# Patient Record
Sex: Female | Born: 1960 | Race: White | Hispanic: No | Marital: Married | Smoking: Former smoker
Health system: Southern US, Community
[De-identification: ages and names within clinical notes are randomized; demographics above are authoritative.]

## PROBLEM LIST (undated history)

## (undated) DIAGNOSIS — M8580 Other specified disorders of bone density and structure, unspecified site: Secondary | ICD-10-CM

## (undated) DIAGNOSIS — R140 Abdominal distension (gaseous): Secondary | ICD-10-CM

## (undated) DIAGNOSIS — M1990 Unspecified osteoarthritis, unspecified site: Secondary | ICD-10-CM

## (undated) DIAGNOSIS — Z8742 Personal history of other diseases of the female genital tract: Secondary | ICD-10-CM

## (undated) DIAGNOSIS — F419 Anxiety disorder, unspecified: Secondary | ICD-10-CM

## (undated) DIAGNOSIS — IMO0001 Reserved for inherently not codable concepts without codable children: Secondary | ICD-10-CM

## (undated) DIAGNOSIS — K5790 Diverticulosis of intestine, part unspecified, without perforation or abscess without bleeding: Secondary | ICD-10-CM

## (undated) HISTORY — PX: REDUCTION MAMMAPLASTY: SUR839

## (undated) HISTORY — PX: ABDOMINAL HYSTERECTOMY: SHX81

## (undated) HISTORY — PX: BREAST ENHANCEMENT SURGERY: SHX7

## (undated) HISTORY — DX: Other specified disorders of bone density and structure, unspecified site: M85.80

## (undated) HISTORY — PX: AUGMENTATION MAMMAPLASTY: SUR837

## (undated) HISTORY — DX: Personal history of other diseases of the female genital tract: Z87.42

## (undated) HISTORY — DX: Abdominal distension (gaseous): R14.0

## (undated) HISTORY — DX: Diverticulosis of intestine, part unspecified, without perforation or abscess without bleeding: K57.90

## (undated) HISTORY — PX: COSMETIC SURGERY: SHX468

## (undated) HISTORY — PX: OTHER SURGICAL HISTORY: SHX169

## (undated) HISTORY — DX: Reserved for inherently not codable concepts without codable children: IMO0001

## (undated) HISTORY — PX: CLAVICLE SURGERY: SHX598

## (undated) HISTORY — PX: COLONOSCOPY: SHX174

---

## 1998-08-12 HISTORY — PX: OTHER SURGICAL HISTORY: SHX169

## 1999-04-10 ENCOUNTER — Other Ambulatory Visit: Admission: RE | Admit: 1999-04-10 | Discharge: 1999-04-10 | Payer: Self-pay | Admitting: Gynecology

## 2002-04-26 ENCOUNTER — Other Ambulatory Visit: Admission: RE | Admit: 2002-04-26 | Discharge: 2002-04-26 | Payer: Self-pay | Admitting: Gynecology

## 2003-03-08 ENCOUNTER — Encounter: Payer: Self-pay | Admitting: Orthopedic Surgery

## 2003-03-08 ENCOUNTER — Ambulatory Visit (HOSPITAL_COMMUNITY): Admission: RE | Admit: 2003-03-08 | Discharge: 2003-03-09 | Payer: Self-pay | Admitting: Orthopedic Surgery

## 2004-01-10 ENCOUNTER — Encounter: Admission: RE | Admit: 2004-01-10 | Discharge: 2004-03-02 | Payer: Self-pay | Admitting: Allergy

## 2005-06-17 ENCOUNTER — Ambulatory Visit: Payer: Self-pay | Admitting: Internal Medicine

## 2005-06-24 ENCOUNTER — Ambulatory Visit (HOSPITAL_COMMUNITY): Admission: RE | Admit: 2005-06-24 | Discharge: 2005-06-24 | Payer: Self-pay | Admitting: Internal Medicine

## 2005-07-02 ENCOUNTER — Ambulatory Visit: Payer: Self-pay | Admitting: Internal Medicine

## 2005-08-13 ENCOUNTER — Ambulatory Visit: Payer: Self-pay | Admitting: Internal Medicine

## 2010-07-04 ENCOUNTER — Encounter: Payer: Self-pay | Admitting: Internal Medicine

## 2010-07-04 ENCOUNTER — Telehealth: Payer: Self-pay | Admitting: Internal Medicine

## 2010-07-10 ENCOUNTER — Ambulatory Visit: Payer: Self-pay | Admitting: Gastroenterology

## 2010-07-10 DIAGNOSIS — K5900 Constipation, unspecified: Secondary | ICD-10-CM | POA: Insufficient documentation

## 2010-09-11 NOTE — Progress Notes (Signed)
Summary: triage  Phone Note Call from Patient Call back at [phone]  (cell)   Caller: Patient Call For: Dr. Juanda Chance Reason for Call: Talk to Nurse Summary of Call: constipation, abd pain and some bloating, fever x 3 days... not comfortable with waiting for an NP3 opening (nothing available right now)... pt also adds that she is having trouble urinating; stopping and starting, urgency, etc. Initial call taken by: Vallarie Mare,  July 04, 2010 9:53 AM  Follow-up for Phone Call        Patient instructed to call her Primary MD to be seen for her multiple symtoms. She will call today. She is concerned about the constipation and abd pain since in the past she has been told she has diverticulosis. Scheduled patient with Willette Cluster, RNP on 07/10/10 at 2:30 PM per patient request. Follow-up by: Jesse Fall RN,  July 04, 2010 10:16 AM  Additional Follow-up for Phone Call Additional follow up Details #1::        reviewed and agree. Additional Follow-up by: Hart Carwin MD,  July 04, 2010 2:19 PM

## 2010-09-11 NOTE — Assessment & Plan Note (Signed)
Summary: constipation, abd pain/[name]   History of Present Illness Visit Type: Initial Visit Primary GI MD: Lina Sar MD Primary Provider: Claude Manges, NP Chief Complaint: abdominal pain & constipation x 3 months History of Present Illness:   Patient is a 50 year old female who had a colonoscopy here 2006 for constipation. She has continued to battle constipation on a daily basis and has tried various medications without success. Constipation is associated with pre-defecatory cramping and sometimes even nausea.  Low dose Amitiza didn't work, higher dose caused diarrhea.  Tried Miralax once daily but caused excessive gas.  Zelnorm used to work great. She has occasional bright red blood per rectum which she attributes to straining. Her constipation has progressed over the last six months and is controlling her life. Probably doesn't get enough fiber.      GI Review of Systems    Reports abdominal pain, bloating, chest pain, and  nausea.     Location of  Abdominal pain: lower abdomen.    Denies acid reflux, belching, dysphagia with liquids, dysphagia with solids, heartburn, loss of appetite, vomiting, vomiting blood, weight loss, and  weight gain.      Reports constipation, diverticulosis, and  hemorrhoids.     Denies anal fissure, black tarry stools, change in bowel habit, diarrhea, fecal incontinence, heme positive stool, irritable bowel syndrome, jaundice, light color stool, liver problems, rectal bleeding, and  rectal pain. Preventive Screening-Counseling & Management  Alcohol-Tobacco     Smoking Status: quit      Drug Use:  no.      Current Medications (verified): 1)  Trazodone Hcl 150 Mg Tabs (Trazodone Hcl) .... 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  Alcohol Use - no Daily Caffeine Use Illicit Drug Use - no Smoking Status:  quit Drug Use:  no  Review of Systems  The patient denies allergy/sinus, anemia, anxiety-new, arthritis/joint pain, back pain, blood in urine, breast changes/lumps, change in vision, confusion, cough, coughing up blood, depression-new, fainting, fatigue, fever, headaches-new, hearing problems, heart murmur, heart rhythm changes, itching, menstrual pain, muscle pains/cramps, night sweats, nosebleeds, pregnancy symptoms, shortness of breath, skin rash, sleeping problems, sore throat, swelling of feet/legs, swollen lymph glands, thirst - excessive , urination - excessive , urination changes/pain, urine leakage, vision changes, and voice change.    Vital Signs:  Patient profile:   50 year old female Height:      67 inches Weight:      137.25 pounds BMI:     21.57 Temp:     98.3 degrees F oral Pulse rate:   64 / minute Pulse rhythm:   regular BP sitting:   118 / 80  (left arm) Cuff size:   regular  Vitals Entered By: June McMurray CMA Duncan Dull) (July 10, 2010 2:33 PM)  Physical Exam  General:  Well developed, well nourished, no acute distress. Head:  Normocephalic and atraumatic. Neck:  no obvious masses  Lungs:  Clear throughout to auscultation. Heart:  Regular rate and rhythm; no murmurs, rubs,  or bruits. Abdomen:  Abdomen soft, nontender, nondistended. No obvious masses or hepatomegaly.Normal bowel sounds.  Rectal:  Small skin tag. No other external or internal lesions apprecitated. No stool in vault. Msk:  Symmetrical with no gross deformities. Normal posture. Extremities:  No palmar erythema, no edema.  Neurologic:  Alert and  oriented x4;  grossly normal neurologically. Skin:  Intact without significant lesions or rashes. Psych:  Alert and cooperative. Normal mood and affect.   Impression & Recommendations:  Problem # 1:  CONSTIPATION  (ICD-564.00) Assessment Deteriorated Chronic constipation with cramping. We do have a research study for idiopathic constipation and I had the patient speak with research department. For now I will purge her bowels for immediate relief. If she decides not to participate in study, or isn't a candidate, then we will need to find an aggressive bowel regimen which works for her.   Patient Instructions: 1)  Take Citrucel daily. 2)  We sent a prescription for Golytely bowel prep to [facility] [address].  Take as directed. 3)  Please call us after you have done the bowel prep and give [place] an update. Ask for Pam at [phone]. 4)  Copy sent to :Claude Manges, ACNP  5)  The medication list was reviewed and reconciled.  All changed / newly prescribed medications were explained.  A complete medication list was provided to the patient / caregiver. Prescriptions: GOLYTELY 227.1 GM SOLR (PEG 3350-KCL-NABCB-NACL-NASULF) Take 1/2  prep one day and the other 1/2 the next day. Drink 8 0z glass every 15 min until gone.  #1 prep x 0   Entered by:   Lowry Ram NCMA   Authorized by:   Willette Cluster NP   Signed by:   Lowry Ram NCMA on 07/10/2010   Method used:   Electronically to        [id number]       [address]       [place], [place]  [address]       Ph: [phone] or [phone]       Fax: [phone]   RxID:   [id number]

## 2010-09-11 NOTE — Procedures (Signed)
Summary: [facility] COLON   Colonoscopy  Procedure date:  07/02/2005  Findings:      Location:  [facility].    Procedures Next Due Date:    Colonoscopy: 07/2015 Patient Name: Barbara Bradford, Barbara s. MRN:  Procedure Procedures: Colonoscopy CPT: [phone].  Personnel: Endoscopist: Dora L. Juanda Chance, MD.  Exam Location: Exam performed in Outpatient Clinic. Outpatient  Patient Consent: Procedure, Alternatives, Risks and Benefits discussed, consent obtained, from patient. Consent was obtained by the RN.  Indications Symptoms: Constipation Patient's stools are infrequent. Patient has difficulty evacuating, strains with stool passage. Abdominal pain / bloating.  History  Current Medications: Patient is not currently taking Coumadin.  Pre-Exam Physical: Performed Jul 02, 2005. Entire physical exam was normal.  Exam Exam: Extent of exam reached: Cecum, extent intended: Cecum.  The cecum was identified by appendiceal orifice and IC valve. Colon retroflexion performed. Images taken. ASA Classification: I. Tolerance: good.  Monitoring: Pulse and BP monitoring, Oximetry used. Supplemental O2 given.  Colon Prep Used Miralax for colon prep. Prep results: good.  Sedation Meds: Patient assessed and found to be appropriate for moderate (conscious) sedation. Fentanyl 100 mcg. given IV. Versed 10 mg. given IV.  Findings - NORMAL EXAM: Cecum.  - DIVERTICULOSIS: Descending Colon to Sigmoid Colon. ICD9: Diverticulosis: 562.10. Comments: moderately severe diverticulosis, thick folds, narrow lumen, no obstruction.  - NORMAL EXAM: Rectum.   Assessment Abnormal examination, see findings above.  Diagnoses: 562.10: Diverticulosis.   Comments: moderately severe diverticulosis, no obstruction Events  Unplanned Interventions: No intervention was required.  Unplanned Events: There were no complications. Plans Medication Plan: Anti-constipation: Zelnorm 6 BID, starting Jul 02, 2005    Patient Education: Patient given standard instructions for: Yearly hemoccult testing recommended. Patient instructed to get routine colonoscopy every 10 years.  Disposition: After procedure patient sent to recovery. After recovery patient sent home.  Scheduling/Referral: Clinic Visit, to Dora L. Juanda Chance, MD, around Aug 12, 2005.    cc: Alanson Aly. Lenise Arena, MD     Teodora Medici, MD  This report was created from the original endoscopy report, which was reviewed and signed by the above listed endoscopist.

## 2010-09-11 NOTE — Letter (Signed)
Summary: New Patient letter  [facility]  [address] [place], [place] [address]   Phone: [phone]  Fax: [phone]       07/04/2010 MRN: [id number]  [facility] [address] [place], [place]  [address]  Dear Ms. Gores,  Welcome to the Gastroenterology Division at [facility]    You are scheduled to see Willette Cluster, RN on 07/09/10  at 2:30 PM on the 3rd floor at [facility], [address]  We ask that you try to arrive at our office 15 minutes prior to your appointment time to allow for check-in.  We would like you to complete the enclosed self-administered evaluation form prior to your visit and bring it with you on the day of your appointment.  We will review it with you.  Also, please bring a complete list of all your medications or, if you prefer, bring the medication bottles and we will list them.  Please bring your insurance card so that we may make a copy of it.  If your insurance requires a referral to see a specialist, please bring your referral form from your primary care physician.  Co-payments are due at the time of your visit and may be paid by cash, check or credit card.     Your office visit will consist of a consult with your physician (includes a physical exam), any laboratory testing he/she may order, scheduling of any necessary diagnostic testing (e.g. x-ray, ultrasound, CT-scan), and scheduling of a procedure (e.g. Endoscopy, Colonoscopy) if required.  Please allow enough time on your schedule to allow for any/all of these possibilities.    If you cannot keep your appointment, please call [phone] to cancel or reschedule prior to your appointment date.  This allows [place] the opportunity to schedule an appointment for another patient in need of care.  If you do not cancel or reschedule by 5 p.m. the business day prior to your appointment date, you will be charged a $50.00 late cancellation/no-show fee.    Thank you  for choosing [facility] for your medical needs.  We appreciate the opportunity to care for you.  Please visit [place] at our website  to learn more about our practice.                     Sincerely,                                                             The Gastroenterology Division

## 2010-09-18 ENCOUNTER — Encounter (INDEPENDENT_AMBULATORY_CARE_PROVIDER_SITE_OTHER): Payer: Self-pay | Admitting: *Deleted

## 2010-09-27 NOTE — Assessment & Plan Note (Signed)
  Nurse Visit   Preventive Screening-Counseling & Management  Comments: On 07/10/2010 I spoke with Barbara Bradford when she was in the office regarding the [employer] constipation study. A consent form was given for her review. The patient called me the next day and said that she wanted to participate in the study but was rather uncomfortable and would like to proceed with the rescue medication that Barbara Bradford recommended at this visit prior to the study. Patient was reassured that it would be best to first complete the rescue however she would need to wait 2 weeks before entering the study. The timing of events would have put her randomization visit due between Christmas and New years, and patient said that it would be best to wait until after the holidays. I agreed with the plan. I called the patient on January 3 to schedule her appointment to begin the drug study. I left her a message to call me. Numerous attempts and messages were left at her work number and her home number and the patient never called me back. The last message that I left with Barbara Bradford I told her that it was ok if she changed her mind about the study, but to please call me so that we could follow up with a care plan regarding her constipation. Patient never called me back.  Allergies: No Known Drug Allergies

## 2014-04-23 ENCOUNTER — Encounter (HOSPITAL_COMMUNITY): Payer: Self-pay | Admitting: Emergency Medicine

## 2014-04-23 ENCOUNTER — Emergency Department (HOSPITAL_COMMUNITY)
Admission: EM | Admit: 2014-04-23 | Discharge: 2014-04-23 | Disposition: A | Discharge status: Home / Self Care | Payer: 59 | Attending: Emergency Medicine | Admitting: Emergency Medicine

## 2014-04-23 ENCOUNTER — Emergency Department (HOSPITAL_COMMUNITY): Payer: 59

## 2014-04-23 DIAGNOSIS — Y9289 Other specified places as the place of occurrence of the external cause: Secondary | ICD-10-CM | POA: Diagnosis not present

## 2014-04-23 DIAGNOSIS — S42031A Displaced fracture of lateral end of right clavicle, initial encounter for closed fracture: Secondary | ICD-10-CM

## 2014-04-23 DIAGNOSIS — S42033A Displaced fracture of lateral end of unspecified clavicle, initial encounter for closed fracture: Secondary | ICD-10-CM | POA: Diagnosis not present

## 2014-04-23 DIAGNOSIS — Y9389 Activity, other specified: Secondary | ICD-10-CM | POA: Diagnosis not present

## 2014-04-23 DIAGNOSIS — W010XXA Fall on same level from slipping, tripping and stumbling without subsequent striking against object, initial encounter: Secondary | ICD-10-CM | POA: Diagnosis not present

## 2014-04-23 DIAGNOSIS — S4980XA Other specified injuries of shoulder and upper arm, unspecified arm, initial encounter: Secondary | ICD-10-CM | POA: Insufficient documentation

## 2014-04-23 DIAGNOSIS — S46909A Unspecified injury of unspecified muscle, fascia and tendon at shoulder and upper arm level, unspecified arm, initial encounter: Secondary | ICD-10-CM | POA: Insufficient documentation

## 2014-04-23 MED ORDER — HYDROCODONE-ACETAMINOPHEN 5-325 MG PO TABS: 1.0000 | ORAL_TABLET | Freq: Four times a day (QID) | ORAL | Status: DC | PRN

## 2014-04-23 MED ORDER — IBUPROFEN 400 MG PO TABS
800.0000 mg | ORAL_TABLET | Freq: Once | ORAL | Status: AC
  Administered 2014-04-23: 800 mg via ORAL
  Filled 2014-04-23: qty 2

## 2014-04-23 NOTE — ED Notes (Signed)
She tripped on hose in yard and fell onto R shoulder. shes had R shoulder pain since. Pain increased with movement. She can wiggle digits, skin w/d, pulses intact

## 2014-04-23 NOTE — Progress Notes (Signed)
Orthopedic Tech Progress Note Patient Details:  Barbara Bradford [birth year] [id number]  Ortho Devices Type of Ortho Device: Arm sling Ortho Device/Splint Location: rue Ortho Device/Splint Interventions: Application   Hildred Priest 04/23/2014, 2:52 PM

## 2014-04-23 NOTE — Discharge Instructions (Signed)
Clavicle Fracture (Distal End) with Rehab Distal clavicle fractures are breaks in the collarbone (clavicle) that occur in the outer third portion of the bone, near the joint between the collarbone and one of the shoulder bones (acromion). These breaks (fractures) may be complete or incomplete. If the fracture extends into the joint at the top of the shoulder, it may also cause damage to the ligaments there (acromioclavicular and coracoclavicular). These two ligaments are responsible for attaching the collarbone to the shoulder bone. SYMPTOMS   Pain, tenderness, and swelling on top of the shoulder.  Visible deformity or bump over the fracture site, if the fracture is complete and the bone fragments separate enough to distort the appearance of the top of the shoulder.  Bruising (contusion) at the site of injury (usually within 48 hours).  Loss of strength, or pain with use of the affected arm.  Sometimes, numbness or coldness in the shoulder and arm on the affected side if the blood supply is impaired.  Uncommonly, shortness of breath or difficulty breathing. CAUSES  Distal clavicle fractures are usually caused by direct hit (trauma) to the area of injury. The injury may also occur from indirect trauma, such as falling on an outstretched hand (uncommon). RISK INCREASES WITH:  Sports that require contact or collision (football, soccer, hockey, rugby).  Sports with high risk of falling on the shoulder (rodeo riding, mountain bike riding, cycling).  Previous shoulder injury.  Improperly fitted or padded protective equipment.  History of bone or joint disease (osteoporosis, bone tumors). PREVENTION  Warm up and stretch properly before activity.  Maintain physical fitness:  Strength, flexibility, and endurance.  Cardiovascular fitness.  Wear properly fitted and padded protective equipment.  Learn and use proper technique, and have a coach correct improper technique (including  falling). PROGNOSIS  If treated properly, distal clavicle fractures usually can be expected to heal. However, surgery may be needed.  RELATED COMPLICATIONS   Pressure on or injury to nearby nerves, ligaments, tendons, muscles, blood vessels, or other tissues.  Weakness and fatigue of the arm or shoulder (uncommon).  Fracture fails to heal (nonunion).  Fracture heals in improper position (malunion).  Arthritis, pain, and inflammation of the acromioclavicular (AC) joint.  Longer healing time and vulnerable to recurring injury, if usual activities are resumed too soon.  Excessive scar tissue at the fracture site, including excessive bone formation, causing pressure on nerves and blood vessels in the neck or armpit. This may lead to pain, numbness, and tingling in the neck, shoulder, arms, and hands.  Infection if the bone breaks through the skin (open fracture), or at the incision if surgery is performed.  Persistent bump (prominence) at the fracture site.  Vulnerable to repeated collarbone injury. TREATMENT  Treatment first involves the use of ice, medicine, and compressive bandages to reduce pain and inflammation. The shoulder should be immediately restrained. It is important to have an orthopedic specialist look at the fracture to determine if surgery is needed to realign the bones if the fracture is out of alignment. Surgery involves repositioning the bones and fixing them in place with screws, pins, and plates. It may be necessary to remove the hardware after the fracture heals. After the fracture heals, it is important to complete stretching and strengthening exercises in order to regain strength and a full range of motion before you are able to return to sports. These exercises may be completed at home or with a therapist. If surgery is required, return to sports can be expected  in 2 to 6 months.  MEDICATION   If pain medicine is needed, nonsteroidal anti-inflammatory medicines  (aspirin and ibuprofen), or other minor pain relievers (acetaminophen), are often advised.  Do not take pain medicine for 7 days before surgery.  Prescription pain relievers may be given if your caregiver thinks they are needed. Use only as directed and only as much as you need. COLD THERAPY   Cold treatment (icing) should be applied for 10 to 15 minutes every 2 to 3 hours for inflammation and pain, and immediately after activity that aggravates your symptoms. Use ice packs or an ice massage. SEEK MEDICAL CARE IF:   Pain, swelling, or bruising gets worse despite treatment.  You experience pain, numbness, or coldness in the arm.  Blue, gray, or dark color appears in the hand or fingernails.  You have increased pain, swelling, or drainage of fluids in the affected area.  You experience signs of infection: increased pain, swelling, drainage of fluids, fever, or a general ill feeling.  New, unexplained symptoms develop. (Drugs used in treatment may produce side effects.) EXERCISES RANGE OF MOTION (ROM) AND STRETCHING EXERCISES - Clavicle Fracture (Distal End) These exercises may help you restore your elbow mobility once your physician has discontinued your restraint period. Beginning exercises before your caregiver's approval may result in delayed healing. Your symptoms may go away with or without further involvement from your physician, physical therapist, or athletic trainer. While completing these exercises, remember:   Restoring tissue flexibility helps normal motion to return to the joints. This allows healthier, less painful movement and activity.  An effective stretch should be held for at least 30 seconds. A stretch should never be painful. You should only feel a gentle lengthening or release in the stretch. ROM - Pendulum   Bend at the waist, so that your right / left arm falls away from your body. Support yourself with your opposite hand on a solid surface, such as a table or a  countertop.  Your right / left arm should be perpendicular to the ground. If it is not perpendicular, you need to lean over farther. Relax the muscles in your right / left arm and shoulder as much as possible.  Gently sway your hips and trunk, so they move your right / left arm without any use of your right / left shoulder muscles.  Progress your movements so that your right / left arm moves side to side, then forward and backward, and finally, both clockwise and counterclockwise.  Complete __________ repetitions in each direction. Many people use this exercise to relieve discomfort in their shoulder, as well as to gain range of motion. Repeat __________ times. Complete this exercise __________ times per day. STRETCH - Flexion, Seated   Sit in a firm chair, so that your right / left forearm can rest on a table or countertop. Your right / left elbow should rest below the height of your shoulder, so that your shoulder feels supported and not tense or uncomfortable.  Keeping your right / left shoulder relaxed, lean forward at your waist, allowing your right / left hand to slide forward. Bend forward until you feel a moderate stretch in your shoulder, but before you feel an increase in your pain.  Hold for __________ seconds. Slowly return to your starting position. Repeat __________ times. Complete this exercise __________ times per day.  STRETCH - Flexion, Standing   Stand with good posture. With an underhand grip on your right / left hand and an overhand  grip on the opposite hand, grasp a broomstick or cane so that your hands are a little more than shoulder width apart.  Keeping your right / left elbow straight and shoulder muscles relaxed, push the stick with your opposite hand to raise your right / left arm in front of your body and then overhead. Raise your arm until you feel a stretch in your right / left shoulder, but before you have increased shoulder pain.  Try to avoid shrugging your  right / left shoulder as your arm rises, by keeping your shoulder blade tucked down and toward your mid-back spine. Hold for __________ seconds.  Slowly return to the starting position. Repeat __________ times. Complete this exercise __________ times per day.  STRETCH - Abduction, Supine   Lie on your back. With an underhand grip on your right / left hand and an overhand grip on the opposite hand, grasp a broomstick or cane so that your hands are a little more than shoulder width apart.  Keeping your right / left elbow straight and shoulder muscles relaxed, push the stick with your opposite hand to raise your right / left arm out to the side of your body and then overhead. Raise your arm until you feel a stretch in your right / left shoulder, but before you have increased shoulder pain.  Try to avoid shrugging your right / left shoulder as your arm rises, by keeping your shoulder blade tucked down and toward your mid-back spine. Hold for __________ seconds.  Slowly return to the starting position. Repeat __________ times. Complete this exercise __________ times per day.  ROM - Flexion, Active-Assisted  Lie on your back. You may bend your knees for comfort.  Grasp a broomstick or cane, so your hands are about shoulder width apart. Your right / left hand should grip the end of the stick, so that your hand is positioned "thumbs-up," as if you were about to shake hands.  Using your healthy arm to lead, raise your right / left arm overhead, until you feel a gentle stretch in your shoulder. Hold for __________ seconds.  Use the stick to assist in returning your right / left arm to its starting position. Repeat __________ times. Complete this exercise __________ times per day.  STRETCH - Flexion, Standing   Stand facing a wall. Walk your right / left fingers up the wall, until you feel a moderate stretch in your shoulder. As your hand gets higher, you may need to step closer to the wall or use a  door frame to walk through.  Try to avoid shrugging your right / left shoulder as your arm rises, by keeping your shoulder blade tucked down and toward your mid-back spine.  Hold for __________ seconds. Use your other hand, if needed, to ease out of the stretch and return to the starting position. Repeat __________ times. Complete this exercise __________ times per day.  STRENGTHENING EXERCISES - Clavicle Fracture (Distal End) These exercises may help you when beginning to rehabilitate your injury. They may resolve your symptoms with or without further involvement from your physician, physical therapist or athletic trainer. While completing these exercises, remember:   Muscles can gain both the endurance and the strength needed for everyday activities through controlled exercises.  Complete these exercises as instructed by your physician, physical therapist or athletic trainer. Increase the resistance and repetitions only as guided.  You may experience muscle soreness or fatigue, but the pain or discomfort you are trying to eliminate should never  worsen during these exercises. If this pain does get worse, stop and make sure you are following the directions exactly. If the pain is still present after adjustments, discontinue the exercise until you can discuss the trouble with your caregiver. STRENGTH - Shoulder Abductors, Isometric   With good posture, stand or sit about 4-6 inches from a wall, with your right / left side facing the wall.  Bend your right / left elbow. Gently press your right / left elbow into the wall. Increase the pressure gradually until you are pressing as hard as you can, without shrugging your shoulder or increasing any shoulder discomfort.  Hold for __________ seconds.  Release the tension slowly. Relax your shoulder muscles completely before you start the next repetition. Repeat __________ times. Complete this exercise __________ times per day.  STRENGTH - Shoulder  Flexion, Isometric   With good posture, stand or sit about 4-6 inches from a wall.  Keeping your right / left elbow straight, gently press the top of your fist into the wall. Increase the pressure gradually until you are pressing as hard as you can, without shrugging your shoulder or increasing any shoulder discomfort.  Hold for __________ seconds.  Release the tension slowly. Relax your shoulder muscles completely before you start the next repetition. Repeat __________ times. Complete this exercise __________ times per day.  STRENGTH - External Rotators  Secure a rubber exercise band or tubing to a fixed object (table, pole), so that it is at the same height as your right / left elbow, when you are standing or sitting on a firm surface.  Stand or sit so that the secured exercise band is at your healthy side.  Bend your right / left elbow 90 degrees. Place a folded towel or small pillow under your right / left arm, so that your elbow is a few inches away from your side.  Keeping the tension on the exercise band, pull it away from your body, as if pivoting on your elbow. Be sure to keep your body steady, so that the movement is coming from only your rotating shoulder.  Hold for __________ seconds. Release the tension in a controlled manner, as you return to the starting position. Repeat __________ times. Complete this exercise __________ times per day.  STRENGTH - Internal Rotators   Secure a rubber exercise band or tubing to a fixed object (table, pole) so that it is at the same height as your right / left elbow, when you are standing or sitting on a firm surface.  Stand or sit so that the secured exercise band is at your right / left side.  Bend your right / left elbow 90 degrees. Place a folded towel or small pillow under your right / left arm, so that your elbow is a few inches away from your side.  Keeping the tension on the exercise band, pull it across your body toward your  stomach. Be sure to keep your body steady, so that the movement is coming from only your rotating shoulder.  Hold for __________ seconds. Release the tension in a controlled manner, as you return to the starting position. Repeat __________ times. Complete this exercise __________ times per day.  Document Released: 07/29/2005 Document Revised: 12/13/2013 Document Reviewed: 11/10/2008 [facility] Patient Information 2015 [place], [place]. This information is not intended to replace advice given to you by your health care provider. Make sure you discuss any questions you have with your health care provider.

## 2014-04-23 NOTE — ED Notes (Signed)
Discharged with instructions using the teach back method. Patient verbalizes an understanding

## 2014-04-23 NOTE — ED Provider Notes (Signed)
CSN: [id number]     Arrival date & time 04/23/14  1241 History   This chart was scribed for a non-physician practitioner, Margarita Mail, PA-C working with Ezequiel Essex, MD by Martinique Peace, ED Scribe. The patient was seen in TR10C/TR10C. The patient's care was started at 2:30 PM.      Chief Complaint  Patient presents with  . Shoulder Injury      The history is provided by the patient. No language interpreter was used.   HPI Comments: Barbara Bradford is a 53 y.o. female who presents to the Emergency Department complaining of right shoulder pian onset earlier today when pt tripped over a hose in the yard and fell on her right shoulder. She states that pain at it's worst is 7/10 but when she is not doing anything, pain subsides briefly. Pain is exacerbated with movement. Pt reports history of bilateral clavicle fractures in the past. Pt is non-smoker.    No past medical history on file. No past surgical history on file. No family history on file. History  Substance Use Topics  . Smoking status: Never Smoker   . Smokeless tobacco: Not on file  . Alcohol Use: Yes   OB History   Grav Para Term Preterm Abortions TAB SAB Ect Mult Living                 Review of Systems  Constitutional: Negative for fever and chills.  Gastrointestinal: Negative for nausea and vomiting.  Musculoskeletal:       Right shoulder pain with associated swelling over clavicle.       Allergies  Review of patient's allergies indicates no known allergies.  Home Medications   Prior to Admission medications   Medication Sig Start Date End Date Taking? Authorizing Provider  ibuprofen (ADVIL,MOTRIN) 200 MG tablet Take 800 mg by mouth every 8 (eight) hours as needed (pain).   Yes Historical Provider, MD  HYDROcodone-acetaminophen (NORCO) 5-325 MG per tablet Take 1-2 tablets by mouth every 6 (six) hours as needed for moderate pain. 04/23/14   Sharah Finnell, PA-C   BP 129/66  Pulse 81  Temp(Src) 97.4  F (36.3 C) (Oral)  Resp 18  SpO2 100% Physical Exam  Nursing note and vitals reviewed. Constitutional: She is oriented to person, place, and time. She appears well-developed and well-nourished. No distress.  HENT:  Head: Normocephalic and atraumatic.  Eyes: Conjunctivae and EOM are normal.  Neck: Neck supple. No tracheal deviation present.  Cardiovascular: Normal rate.   Pulmonary/Chest: Effort normal. No respiratory distress.  Musculoskeletal: Normal range of motion.  Neurological: She is alert and oriented to person, place, and time.  Skin: Skin is warm and dry.  Psychiatric: She has a normal mood and affect. Her behavior is normal.    ED Course  Procedures (including critical care time) Labs Review Labs Reviewed - No data to display  No results found for this or any previous visit. No results found.    Imaging Review No results found.   EKG Interpretation None     Medications  ibuprofen (ADVIL,MOTRIN) tablet 800 mg (800 mg Oral Given 04/23/14 1443)    2:34 PM- Treatment plan was discussed with patient who verbalizes understanding and agrees.   MDM   Final diagnoses:  Closed fracture of distal clavicle, right, initial encounter   Patient with well-aligned Distal R clavicle fracture, Placed in sling.Fracture appears stable. D/c with pain meds and ortho f/u  I personally performed the services described in this documentation,  which was scribed in my presence. The recorded information has been reviewed and is accurate.      Margarita Mail, PA-C 04/30/14 1811

## 2014-04-23 NOTE — ED Notes (Signed)
Patient advised that she was working in the yard today and fell struck the right shoulder on the concrete. Rates pain a 5/10. Family present.

## 2014-05-01 NOTE — ED Provider Notes (Signed)
Medical screening examination/treatment/procedure(s) were performed by non-physician practitioner and as supervising physician I was immediately available for consultation/collaboration.   EKG Interpretation None       Ezequiel Essex, MD 05/01/14 1044

## 2015-09-04 ENCOUNTER — Encounter: Payer: Self-pay | Admitting: Internal Medicine

## 2015-11-29 ENCOUNTER — Encounter: Payer: Self-pay | Admitting: Gastroenterology

## 2016-01-09 ENCOUNTER — Ambulatory Visit (AMBULATORY_SURGERY_CENTER): Payer: Self-pay

## 2016-01-09 VITALS — Ht 65.0 in | Wt 161.4 lb

## 2016-01-09 DIAGNOSIS — Z1211 Encounter for screening for malignant neoplasm of colon: Secondary | ICD-10-CM

## 2016-01-09 MED ORDER — NA SULFATE-K SULFATE-MG SULF 17.5-3.13-1.6 GM/177ML PO SOLN: ORAL | Status: DC

## 2016-01-09 NOTE — Progress Notes (Signed)
Per pt, no allergies to soy or egg products.Pt not taking any weight loss meds or using  O2 at home. 

## 2016-01-11 ENCOUNTER — Encounter: Payer: Self-pay | Admitting: Gastroenterology

## 2016-01-23 ENCOUNTER — Ambulatory Visit (AMBULATORY_SURGERY_CENTER): Payer: 59 | Admitting: Gastroenterology

## 2016-01-23 ENCOUNTER — Encounter: Payer: Self-pay | Admitting: Gastroenterology

## 2016-01-23 VITALS — BP 106/70 | HR 59 | Temp 97.8°F | Resp 17 | Ht 65.0 in | Wt 161.0 lb

## 2016-01-23 DIAGNOSIS — D122 Benign neoplasm of ascending colon: Secondary | ICD-10-CM | POA: Diagnosis not present

## 2016-01-23 DIAGNOSIS — Z1211 Encounter for screening for malignant neoplasm of colon: Secondary | ICD-10-CM | POA: Diagnosis not present

## 2016-01-23 DIAGNOSIS — K5732 Diverticulitis of large intestine without perforation or abscess without bleeding: Secondary | ICD-10-CM

## 2016-01-23 MED ORDER — SODIUM CHLORIDE 0.9 % IV SOLN: 500.0000 mL | INTRAVENOUS | Status: DC

## 2016-01-23 MED ORDER — BENEFIBER PO POWD: ORAL | Status: DC

## 2016-01-23 NOTE — Op Note (Signed)
[name] Patient Name: Barbara Bradford Procedure Date: 01/23/2016 1:21 PM MRN: [id number] Endoscopist: Mauri Pole , MD Age: 55 Referring MD:  Date of Birth: 1961/05/20 Gender: Female Procedure:                Colonoscopy Indications:              Screening for colorectal malignant neoplasm, Last                            colonoscopy 10 years ago Medicines:                Monitored Anesthesia Care Procedure:                Pre-Anesthesia Assessment:                           - Prior to the procedure, a History and Physical                            was performed, and patient medications and                            allergies were reviewed. The patient's tolerance of                            previous anesthesia was also reviewed. The risks                            and benefits of the procedure and the sedation                            options and risks were discussed with the patient.                            All questions were answered, and informed consent                            was obtained. Prior Anticoagulants: The patient has                            taken no previous anticoagulant or antiplatelet                            agents. ASA Grade Assessment: I - A normal, healthy                            patient. After reviewing the risks and benefits,                            the patient was deemed in satisfactory condition to                            undergo the procedure.  After obtaining informed consent, the colonoscope                            was passed under direct vision. Throughout the                            procedure, the patient's blood pressure, pulse, and                            oxygen saturations were monitored continuously. The                            Model PCF-H190L [id number]) scope was introduced                            through the anus and advanced to the the cecum,                 identified by appendiceal orifice and ileocecal                            valve. The colonoscopy was performed without                            difficulty. The colonoscopy was somewhat difficult                            due to multiple diverticula in the colon,                            restricted mobility of the colon, significant                            looping and a tortuous colon. Successful completion                            of the procedure was aided by changing the patient                            to a supine position and withdrawing the scope and                            replacing with the pediatric colonoscope. The                            patient tolerated the procedure well. The quality                            of the bowel preparation was good. The ileocecal                            valve, appendiceal orifice, and rectum were                            photographed.  Scope In: 1:36:39 PM Scope Out: 2:21:58 PM Scope Withdrawal Time: 0 hours 9 minutes 0 seconds  Total Procedure Duration: 0 hours 45 minutes 19 seconds  Findings:                 The perianal and digital rectal examinations were                            normal.                           A 3 mm polyp was found in the ascending colon. The                            polyp was sessile. The polyp was removed with a                            cold biopsy forceps. Resection and retrieval were                            complete.                           Multiple small and large-mouthed diverticula were                            found in the sigmoid colon associated with                            tortousity and spasm, descending colon, transverse                            colon and ascending colon.                           Non-bleeding internal hemorrhoids were found during                            retroflexion. The hemorrhoids were small. Complications:            No immediate  complications. Estimated Blood Loss:     Estimated blood loss: none. Impression:               - One 3 mm polyp in the ascending colon, removed                            with a cold biopsy forceps. Resected and retrieved.                           - Severe diverticulosis in the sigmoid colon and                            mild to moderate diverticulosis in the descending                            colon, in the transverse colon and in the ascending  colon.                           - Non-bleeding internal hemorrhoids. Recommendation:           - Patient has a contact number available for                            emergencies. The signs and symptoms of potential                            delayed complications were discussed with the                            patient. Return to normal activities tomorrow.                            Written discharge instructions were provided to the                            patient.                           - Resume previous diet.                           - Continue present medications.                           - Await pathology results.                           - Repeat colonoscopy in 5-10 years for surveillance.                           - Return to GI clinic PRN. Mauri Pole, MD 01/23/2016 2:39:18 PM This report has been signed electronically.

## 2016-01-23 NOTE — Patient Instructions (Addendum)
YOU HAD AN ENDOSCOPIC PROCEDURE TODAY AT [facility]:   Refer to the procedure report that was given to you for any specific questions about what was found during the examination.  If the procedure report does not answer your questions, please call your gastroenterologist to clarify.  If you requested that your care partner not be given the details of your procedure findings, then the procedure report has been included in a sealed envelope for you to review at your convenience later.  YOU SHOULD EXPECT: Some feelings of bloating in the abdomen. Passage of more gas than usual.  Walking can help get rid of the air that was put into your GI tract during the procedure and reduce the bloating. If you had a lower endoscopy (such as a colonoscopy or flexible sigmoidoscopy) you may notice spotting of blood in your stool or on the toilet paper. If you underwent a bowel prep for your procedure, you may not have a normal bowel movement for a few days.  Please Note:  You might notice some irritation and congestion in your nose or some drainage.  This is from the oxygen used during your procedure.  There is no need for concern and it should clear up in a day or so.  SYMPTOMS TO REPORT IMMEDIATELY:   Following lower endoscopy (colonoscopy or flexible sigmoidoscopy):  Excessive amounts of blood in the stool  Significant tenderness or worsening of abdominal pains  Swelling of the abdomen that is new, acute  Fever of 100F or higher   For urgent or emergent issues, a gastroenterologist can be reached at any hour by calling [phone].   DIET: Your first meal following the procedure should be a small meal and then it is ok to progress to your normal diet. Heavy or fried foods are harder to digest and may make you feel nauseous or bloated.  Likewise, meals heavy in dairy and vegetables can increase bloating.  Drink plenty of fluids but you should avoid alcoholic beverages for 24 hours.  Drink  plenty of water.  ACTIVITY:  You should plan to take it easy for the rest of today and you should NOT DRIVE or use heavy machinery until tomorrow (because of the sedation medicines used during the test).    FOLLOW UP: Our staff will call the number listed on your records the next business day following your procedure to check on you and address any questions or concerns that you may have regarding the information given to you following your procedure. If we do not reach you, we will leave a message.  However, if you are feeling well and you are not experiencing any problems, there is no need to return our call.  We will assume that you have returned to your regular daily activities without incident.  If any biopsies were taken you will be contacted by phone or by letter within the next 1-3 weeks.  Please call us at [phone] if you have not heard about the biopsies in 3 weeks.    SIGNATURES/CONFIDENTIALITY: You and/or your care partner have signed paperwork which will be entered into your electronic medical record.  These signatures attest to the fact that that the information above on your After Visit Summary has been reviewed and is understood.  Full responsibility of the confidentiality of this discharge information lies with you and/or your care-partner.  Read all of the handouts given to you by your recovery room nurse.  Thank-you for choosing [place] for  your healthcare needs today.  Take benefiber 1 tablespoon three times a day.  Miralax every day 1/2 to full capful per day as directed.

## 2016-01-23 NOTE — Progress Notes (Signed)
Called to room to assist during endoscopic procedure.  Patient ID and intended procedure confirmed with present staff. Received instructions for my participation in the procedure from the performing physician.  

## 2016-01-23 NOTE — Progress Notes (Signed)
  [facility] Post-op Note  Patient: DEIJAH BLONDER  Procedure(s) Performed: colonoscopy  Patient Location: [facility] - Recovery Area  Anesthesia Type: Deep Sedation/Propofol  Level of Consciousness: awake, oriented and patient cooperative  Airway and Oxygen Therapy: Patient Spontanous Breathing  Post-op Pain: none  Post-op Assessment:  Post-op Vital signs reviewed, Patient's Cardiovascular Status Stable, Respiratory Function Stable, Patent Airway, No signs of Nausea or vomiting and Pain level controlled  Post-op Vital Signs: Reviewed and stable  Complications: No apparent anesthesia complications  Micca Matura E 2:29 PM

## 2016-01-24 ENCOUNTER — Telehealth: Payer: Self-pay | Admitting: *Deleted

## 2016-01-24 NOTE — Telephone Encounter (Signed)
No answer, message left for the patient. 

## 2016-01-29 ENCOUNTER — Encounter: Payer: Self-pay | Admitting: Gastroenterology

## 2016-04-03 ENCOUNTER — Ambulatory Visit (INDEPENDENT_AMBULATORY_CARE_PROVIDER_SITE_OTHER): Payer: Self-pay | Admitting: Obstetrics and Gynecology

## 2016-04-03 ENCOUNTER — Encounter: Payer: Self-pay | Admitting: Obstetrics and Gynecology

## 2016-04-03 VITALS — BP 125/81 | HR 79 | Ht 65.0 in | Wt 160.6 lb

## 2016-04-03 DIAGNOSIS — Z1239 Encounter for other screening for malignant neoplasm of breast: Secondary | ICD-10-CM

## 2016-04-03 DIAGNOSIS — Z780 Asymptomatic menopausal state: Secondary | ICD-10-CM

## 2016-04-03 DIAGNOSIS — N952 Postmenopausal atrophic vaginitis: Secondary | ICD-10-CM

## 2016-04-03 DIAGNOSIS — Z8742 Personal history of other diseases of the female genital tract: Secondary | ICD-10-CM

## 2016-04-03 DIAGNOSIS — Z0000 Encounter for general adult medical examination without abnormal findings: Secondary | ICD-10-CM

## 2016-04-03 NOTE — Progress Notes (Signed)
GYN ANNUAL PREVENTATIVE CARE ENCOUNTER NOTE  Subjective:       Barbara Bradford is a 55 y.o. G10P2001 female here for a routine annual gynecologic exam.  Current complaints:  1. Establish care  55 year old white female, single, monogamous, para 2001, status post TAH?RSO 25 years ago for symptomatic endometriosis, presents for establishment of care. Last visit to gynecologist was approximately 5 years ago.   Gynecologic History No LMP recorded. Patient has had a hysterectomy. TAH? RSO Contraception: status post hysterectomy Last Pap: 2012 normal Last mammogram: Remote normal Menarche-89 History of endometriosis History of abnormal Pap smear; history of cone biopsy prior to hysterectomy; following hysterectomy no further abnormal Pap smears  Obstetric History OB History  Gravida Para Term Preterm AB Living  2 2 2     1   SAB TAB Ectopic Multiple Live Births          2    # Outcome Date GA Lbr Len/2nd Weight Sex Delivery Anes PTL Lv  2 Term 11/10/87   10 lb 1.6 oz (4.581 kg) M Vag-Spont   DEC  1 Term 07/02/80 [redacted]w[redacted]d  8 lb 6.4 oz (3.81 kg) F Vag-Spont  N LIV    Obstetric Comments  Son born 80 passed away at age 60yo    Past Medical History:  Diagnosis Date  . Abdominal bloating   . Diverticulosis    history of  . Gas    at times  . Hx of abnormal cervical Pap smear     Past Surgical History:  Procedure Laterality Date  . ABDOMINAL HYSTERECTOMY     has part of one ovary  . BREAST ENHANCEMENT SURGERY    . CLAVICLE SURGERY     left side/had pins removed after a fracture  . hx coloposcopy      Current Outpatient Prescriptions on File Prior to Visit  Medication Sig Dispense Refill  . ibuprofen (ADVIL,MOTRIN) 200 MG tablet Take 800 mg by mouth every 8 (eight) hours as needed (pain). Reported on 01/09/2016    . magnesium 30 MG tablet Take 30 mg by mouth. Take 2 pills nightly prn    . Wheat Dextrin (BENEFIBER) POWD Use tablespoon three times per day one bottle 350 GMS  350 g 10   No current facility-administered medications on file prior to visit.     No Known Allergies  Social History   Social History  . Marital status: Married    Spouse name: N/A  . Number of children: N/A  . Years of education: N/A   Occupational History  . Not on file.   Social History Main Topics  . Smoking status: Never Smoker  . Smokeless tobacco: Never Used  . Alcohol use 1.2 oz/week    2 Cans of beer per week  . Drug use: No  . Sexual activity: Yes    Partners: Male    Birth control/ protection: None   Other Topics Concern  . Not on file   Social History Narrative  . No narrative on file    Family History  Problem Relation Age of Onset  . Dementia Mother   . Diabetes Mother   . Pancreatic cancer Sister     The following portions of the patient's history were reviewed and updated as appropriate: allergies, current medications, past family history, past medical history, past social history, past surgical history and problem list.  Review of Systems ROS   Objective:   BP 125/81   Pulse 79   Ht 5'  5" (1.651 m)   Wt 160 lb 9.6 oz (72.8 kg)   BMI 26.73 kg/m  Physical Exam  Constitutional: She is oriented to person, place, and time. She appears well-developed and well-nourished.  HENT:  Head: Normocephalic and atraumatic.  Eyes: Conjunctivae and EOM are normal.  Neck: Normal range of motion. Neck supple. No thyromegaly present.  Cardiovascular: Normal rate, regular rhythm and normal heart sounds.   No murmur heard. Pulmonary/Chest: Effort normal and breath sounds normal.  Abdominal: Soft. She exhibits no distension and no mass. There is no tenderness. No hernia.  Genitourinary:  Genitourinary Comments: External genitalia-normal BUS-moderate atrophy Cervix-surgically absent Uterus-surgically absent Adnexa-nonpalpable and nontender Rectovaginal-normal external exam; normal sphincter tone; no rectal masses  Musculoskeletal: Normal range of  motion. She exhibits no edema or tenderness.  Lymphadenopathy:    She has no cervical adenopathy.  Neurological: She is alert and oriented to person, place, and time.  Skin: Skin is warm and dry. No rash noted. No erythema.  Psychiatric: She has a normal mood and affect. Her behavior is normal.    Assessment:   Annual gynecologic examination 55 y.o. Contraception: status post hysterectomy Normal BMI Patient Active Problem List   Diagnosis Date Noted  . CONSTIPATION 07/10/2010    Vaginal atrophy Vasomotor symptoms, mild, not desiring ERT at this time Plan:  Pap: Pap Co Test Mammogram: Ordered Labs: Lipid 1, FBS, TSH, Hemoglobin A1C and Vit D Level"". Routine preventative health maintenance measures emphasized: Diet/Weight control, Tobacco Cessation and Alcohol/Drug use Estrace cream intravaginal twice a week A similar FISH would like to begin oral estrogen therapy if hot flashes and night sweats worsen Return to [place], MD   Note: This dictation was prepared with Dragon dictation along with smaller phrase technology. Any transcriptional errors that result from this process are unintentional.

## 2016-04-03 NOTE — Patient Instructions (Signed)
1. Pap smear is obtained 2. Mammogram is ordered 3. Colon cancer screening has already been completed with the recent colonoscopy 4. Recommend calcium 1200 mg a day and vitamin D 800 units a day for prevention of osteoporosis 5. Continue with healthy eating and exercise 6. Start Estrace cream intravaginal 1/2 g twice a week for vaginal dryness 7. Contact us if he would like to start oral estrogen therapy if hot flashes and night sweats worsen 8.  Return in 1 year

## 2016-04-08 LAB — PAP IG AND HPV HIGH-RISK
HPV, high-risk: NEGATIVE
PAP Smear Comment: 0

## 2016-05-16 ENCOUNTER — Ambulatory Visit (INDEPENDENT_AMBULATORY_CARE_PROVIDER_SITE_OTHER): Payer: 59 | Admitting: Orthopaedic Surgery

## 2016-05-16 DIAGNOSIS — M542 Cervicalgia: Secondary | ICD-10-CM | POA: Diagnosis not present

## 2016-05-27 ENCOUNTER — Ambulatory Visit: Payer: 59 | Attending: Physician Assistant | Admitting: Physical Therapy

## 2016-05-27 ENCOUNTER — Encounter: Payer: Self-pay | Admitting: Physical Therapy

## 2016-05-27 DIAGNOSIS — M6281 Muscle weakness (generalized): Secondary | ICD-10-CM | POA: Diagnosis present

## 2016-05-27 DIAGNOSIS — M542 Cervicalgia: Secondary | ICD-10-CM | POA: Insufficient documentation

## 2016-05-27 NOTE — Therapy (Signed)
[place], [place], [address] Phone: [phone]   Fax:  [phone]  Physical Therapy Evaluation  Patient Details  Name: Barbara Bradford MRN: [id number] Date of Birth: [birth year] Referring Provider: Pete Pelt Pa-C  Encounter Date: 05/27/2016      PT End of Session - 05/27/16 1304    Visit Number 1   Number of Visits 13   Date for PT Re-Evaluation 07/12/16   Authorization Type [employer]- 60 visit limit   PT Start Time 1103   PT Stop Time 1145   PT Time Calculation (min) 42 min   Activity Tolerance Patient tolerated treatment well   Behavior During Therapy [facility] for tasks assessed/performed      Past Medical History:  Diagnosis Date  . Abdominal bloating   . Diverticulosis    history of  . Gas    at times  . Hx of abnormal cervical Pap smear     Past Surgical History:  Procedure Laterality Date  . ABDOMINAL HYSTERECTOMY     has part of one ovary  . BREAST ENHANCEMENT SURGERY    . CLAVICLE SURGERY     left side/had pins removed after a fracture  . hx coloposcopy      There were no vitals filed for this visit.       Subjective Assessment - 05/27/16 1106    Subjective History of bilateral fracture in clavicles. A couple of mo ago had difficulty getting comfortable on her pillow. Spasms in neck and pain at occipital region. Moves R to L. 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 Yes   Pain Score 3   7/10 at worse in last 24 hr   Pain Orientation Right;Left;Upper   Pain Descriptors / Indicators Spasm   Pain Onset More than a month ago   Aggravating Factors  sleeping   Pain Relieving Factors muscle relaxers, massage, heating pad            [facility] PT Assessment - 05/27/16 0001      Assessment   Medical Diagnosis cervicalgia   Referring Provider Pete Pelt Pa-C    Hand Dominance Left   Next MD Visit 11/2   Prior Therapy no     Precautions   Precautions None     Restrictions   Weight Bearing Restrictions No     Balance Screen   Has the patient fallen in the past 6 months No     [place] residence   Living Arrangements Non-relatives/Friends     Prior Function   Level of Independence Independent     Cognition   Overall Cognitive Status Within Functional Limits for tasks assessed     Observation/Other Assessments   Focus on Therapeutic Outcomes (FOTO)  45% ability      Posture/Postural Control   Posture Comments GHJ IR, kyphotic, flat thoracic spine     ROM / Strength   AROM / PROM / Strength AROM;Strength     AROM   AROM Assessment Site Cervical   Cervical Flexion 45   Cervical Extension 40   Cervical - Right Side Bend 20   Cervical - Left Side Bend 30   Cervical - Right Rotation --  WFL, tight, cavitations     Strength   Strength Assessment Site Shoulder   Right/Left  Shoulder Right;Left   Right Shoulder Flexion 3+/5   Right Shoulder Internal Rotation 4-/5   Right Shoulder External Rotation 4-/5   Left Shoulder Flexion 3+/5   Left Shoulder External Rotation 4-/5     Palpation   Palpation comment TTP suboccipitals and periscapular.                    [facility] Adult PT Treatment/Exercise - 05/27/16 0001      Therapeutic Activites    Therapeutic Activities Lifting   Lifting transferring and lifting Mom; computer ergonomic positioning     Exercises   Exercises Shoulder;Neck     Neck Exercises: Theraband   Scapula Retraction Limitations verbal and tactile cuing required     Shoulder Exercises: Stretch   Other Shoulder Stretches supine pec stretch     Manual Therapy   Manual Therapy Soft tissue mobilization;Joint mobilization   Joint Mobilization cervical lateral mobilizations   Soft tissue mobilization suboccipital release, trigger point release bilateral upper trap                 PT Education - 05/27/16 1303    Education provided Yes   Education Details anatomy of condition, POC, HEP, exercise form/rationale, lifting techniques, Materials engineer) Educated Patient   Methods Explanation;Demonstration;Tactile cues;Verbal cues   Comprehension Verbalized understanding;Returned demonstration;Verbal cues required;Tactile cues required;Need further instruction          PT Short Term Goals - 05/27/16 1317      PT SHORT TERM GOAL #1   Title Pt will demo proper lifting to transfer and aid mom in standing without increase in neck pain by 11/3   Baseline educated at eval   Time 2   Period Weeks   Status New     PT SHORT TERM GOAL #2   Title Pt will verbalize max pain <=5/10    Baseline up to 7-8/10 at eval   Time 2   Period Weeks   Status New           PT Long Term Goals - 05/27/16 1318      PT LONG TERM GOAL #1   Title Pt will be able to sleep without being woken by neck pain by 12/1   Baseline wakes multiple times at eval   Time 6   Period Weeks   Status New     PT LONG TERM GOAL #2   Title Resolution of HA pain to decrease effects of pain on daily activities   Baseline frequent HA at eval   Time 6   Period Weeks   Status New     PT LONG TERM GOAL #3   Title FOTO to 60% to indicate significant functional improvement   Baseline 45% at eval   Time 6   Period Weeks   Status New     PT LONG TERM GOAL #4   Title Max daily pain <=3/10 with work and care taking activities to improve function and effectiveness in activities through her day   Baseline 7-8/10 at eval   Time [place] - 05/27/16 1304    Clinical Impression Statement Pt presents to PT with complaints of cervical pain that is limiting work and ADL ability. 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 Pt has notable postural  weakness that is  resulting in poor biomechanical use of cervical muscualture. Pt demo poor lifting technique so we reviewed a technique using a sheet today for assistance with her Mom. Pt will benefit from skilled PT in order to decrease cervical pain and improve functional posture strength and endurance.    Rehab Potential Good   PT Frequency 2x / week   PT Duration 6 weeks   PT Treatment/Interventions ADLs/Self Care Home Management;Cryotherapy;Electrical Stimulation;Iontophoresis 4mg /ml Dexamethasone;Functional mobility training;Ultrasound;Traction;Moist Heat;Therapeutic activities;Therapeutic exercise;Neuromuscular re-education;Patient/family education;Passive range of motion;Manual techniques;Dry needling;Taping   PT Next Visit Plan periscapular strength/endurance; evaluate how lifting technique went   PT Home Exercise Plan scapular retraction, computer ergonomics, lifting technique, pec stretch supine   Consulted and Agree with Plan of Care Patient      Patient will benefit from skilled therapeutic intervention in order to improve the following deficits and impairments:  Pain, Improper body mechanics, Postural dysfunction, Decreased strength, Decreased activity tolerance, Increased muscle spasms  Visit Diagnosis: Cervicalgia - Plan: PT plan of care cert/re-cert  Muscle weakness (generalized) - Plan: PT plan of care cert/re-cert     Problem List Patient Active Problem List   Diagnosis Date Noted  . CONSTIPATION 07/10/2010    Gilliam Hawkes C. Shigeko Manard PT, DPT 05/27/16 1:26 PM   [facility] [address] [place], [place], [address] Phone: [phone]   Fax:  [phone]  Name: Barbara Bradford MRN: [id number] Date of Birth: April 03, 1961

## 2016-05-29 ENCOUNTER — Ambulatory Visit: Payer: 59 | Admitting: Physical Therapy

## 2016-06-03 ENCOUNTER — Ambulatory Visit: Payer: 59 | Admitting: Physical Therapy

## 2016-06-03 ENCOUNTER — Encounter: Payer: Self-pay | Admitting: Physical Therapy

## 2016-06-03 DIAGNOSIS — M542 Cervicalgia: Secondary | ICD-10-CM

## 2016-06-03 DIAGNOSIS — M6281 Muscle weakness (generalized): Secondary | ICD-10-CM

## 2016-06-03 NOTE — Therapy (Signed)
[place], [place], [address] Phone: [phone]   Fax:  [phone]  Physical Therapy Treatment  Patient Details  Name: Barbara Bradford MRN: [id number] Date of Birth: 29-Oct-1960 Referring Provider: Pete Pelt Pa-C  Encounter Date: 06/03/2016      PT End of Session - 06/03/16 1342    Visit Number 2   Number of Visits 13   Date for PT Re-Evaluation 07/12/16   PT Start Time 1336   PT Stop Time 1423   PT Time Calculation (min) 47 min   Activity Tolerance Patient tolerated treatment well   Behavior During Therapy [facility] for tasks assessed/performed      Past Medical History:  Diagnosis Date  . Abdominal bloating   . Diverticulosis    history of  . Gas    at times  . Hx of abnormal cervical Pap smear     Past Surgical History:  Procedure Laterality Date  . ABDOMINAL HYSTERECTOMY     has part of one ovary  . BREAST ENHANCEMENT SURGERY    . CLAVICLE SURGERY     left side/had pins removed after a fracture  . hx coloposcopy      There were no vitals filed for this visit.      Subjective Assessment - 06/03/16 1337    Subjective Feeling about the same today. Felt sore but better after last visit. Has tried lifting techniques with Mom which have been helpful. Goind to see surgeon about breast reduction.    Currently in Pain? Yes   Pain Score 4    Pain Location Neck   Pain Orientation Right   Pain Descriptors / Indicators Spasm                         [facility] Adult PT Treatment/Exercise - 06/03/16 0001      Therapeutic Activites    Therapeutic Activities Other Therapeutic Activities   Other Therapeutic Activities sleeping posture     Shoulder Exercises: Seated   Other Seated Exercises thoracic ext over chair     Shoulder Exercises: Prone   Other Prone Exercises prone Ts   Other Prone Exercises prone scap retractin + extension     Shoulder Exercises: Stretch    Other Shoulder Stretches open books x5 each     Manual Therapy   Manual Therapy Taping   Joint Mobilization gross rib ER and throacc PA in prone   Soft tissue mobilization suboccipital release, ischemic release R upper trap   Kinesiotex Facilitate Muscle     Kinesiotix   Facilitate Muscle  scapular retraction and depression                PT Education - 06/03/16 1425    Education provided Yes   Education Details exercise form/rationale, sleeping posture, bra wear, soreness with joint mobility   Person(s) Educated Patient   Methods Explanation;Demonstration;Tactile cues;Verbal cues;Handout   Comprehension Verbalized understanding;Returned demonstration;Verbal cues required;Tactile cues required;Need further instruction          PT Short Term Goals - 05/27/16 1317      PT SHORT TERM GOAL #1   Title Pt will demo proper lifting to transfer and aid mom in standing without increase in neck pain by 11/3   Baseline educated at eval   Time 2   Period Weeks   Status New     PT SHORT TERM GOAL #2   Title Pt will verbalize  max pain <=5/10    Baseline up to 7-8/10 at eval   Time 2   Period Weeks   Status New           PT Long Term Goals - 05/27/16 1318      PT LONG TERM GOAL #1   Title Pt will be able to sleep without being woken by neck pain by 12/1   Baseline wakes multiple times at eval   Time 6   Period Weeks   Status New     PT LONG TERM GOAL #2   Title Resolution of HA pain to decrease effects of pain on daily activities   Baseline frequent HA at eval   Time 6   Period Weeks   Status New     PT LONG TERM GOAL #3   Title FOTO to 60% to indicate significant functional improvement   Baseline 45% at eval   Time 6   Period Weeks   Status New     PT LONG TERM GOAL #4   Title Max daily pain <=3/10 with work and care taking activities to improve function and effectiveness in activities through her day   Baseline 7-8/10 at eval   Time 6   Period Weeks    Status New               Plan - 06/03/16 1426    Clinical Impression Statement Placed kinesiotape for reminder of scapular posture and discussed wearing a racer-back sports bra for support of chest. Will continue to benefit from periscapular strengthening and functional endurance.    PT Next Visit Plan periscapular strength/endurance   PT Home Exercise Plan scapular retraction, computer ergonomics, lifting technique, pec stretch supine; open book, prone retraction + extension, extension over chair   Consulted and Agree with Plan of Care Patient      Patient will benefit from skilled therapeutic intervention in order to improve the following deficits and impairments:     Visit Diagnosis: Cervicalgia  Muscle weakness (generalized)     Problem List Patient Active Problem List   Diagnosis Date Noted  . CONSTIPATION 07/10/2010   .Barbara Bradford C. Habib Kise PT, DPT 06/03/16 2:29 PM   [facility] [address] [place], [place], [address] Phone: [phone]   Fax:  [phone]  Name: Barbara Bradford MRN: [id number] Date of Birth: [birth year]

## 2016-06-05 ENCOUNTER — Ambulatory Visit: Payer: 59 | Admitting: Physical Therapy

## 2016-06-05 DIAGNOSIS — M542 Cervicalgia: Secondary | ICD-10-CM

## 2016-06-05 DIAGNOSIS — M6281 Muscle weakness (generalized): Secondary | ICD-10-CM

## 2016-06-05 NOTE — Therapy (Signed)
[place], [place], [address] Phone: [phone]   Fax:  [phone]  Physical Therapy Treatment  Patient Details  Name: Barbara [place] MRN: [id number] Date of Birth: 1960-09-12 Referring Provider: Pete Pelt Pa-C  Encounter Date: 06/05/2016      PT End of Session - 06/05/16 1336    Visit Number 3   Number of Visits 13   Date for PT Re-Evaluation 07/12/16   Authorization Type [employer]- 60 visit limit   PT Start Time 1331   PT Stop Time 1418   PT Time Calculation (min) 47 min   Activity Tolerance Patient tolerated treatment well   Behavior During Therapy [facility] for tasks assessed/performed      Past Medical History:  Diagnosis Date  . Abdominal bloating   . Diverticulosis    history of  . Gas    at times  . Hx of abnormal cervical Pap smear     Past Surgical History:  Procedure Laterality Date  . ABDOMINAL HYSTERECTOMY     has part of one ovary  . BREAST ENHANCEMENT SURGERY    . CLAVICLE SURGERY     left side/had pins removed after a fracture  . hx coloposcopy      There were no vitals filed for this visit.      Subjective Assessment - 06/05/16 1333    Subjective Pt reports feeling very sore on Monday but feels better now. Occasionally still feels pain when working for a while, driving a car and getting ready for bed.    Currently in Pain? Yes   Pain Score 2    Pain Location Neck   Pain Orientation Right   Pain Radiating Towards HA pain behind L eye                         [facility] Adult PT Treatment/Exercise - 06/05/16 0001      Shoulder Exercises: Prone   Retraction Limitations 2 min + extension   Flexion Limitations 2 min thumbs up   Horizontal ABduction 1 Limitations prone horiz abd 2 min   Horizontal ABduction 2 Limitations hands behind low back 2 min     Manual Therapy   Joint Mobilization prone thoracic PA, gross rib ER; supine L end range facet closing  gr 4 upper cervical; L first rib mob   Soft tissue mobilization suboccipital release, trigger point release bilateral scalnes, levator, upper trap                PT Education - 06/05/16 1418    Education provided Yes   Education Details exercise form/rationale, rationale for manual & trigger points,  dry needling for next visit   Person(s) Educated Patient   Methods Explanation;Demonstration;Tactile cues;Verbal cues   Comprehension Verbalized understanding;Returned demonstration;Verbal cues required;Tactile cues required;Need further instruction          PT Short Term Goals - 05/27/16 1317      PT SHORT TERM GOAL #1   Title Pt will demo proper lifting to transfer and aid mom in standing without increase in neck pain by 11/3   Baseline educated at eval   Time 2   Period Weeks   Status New     PT SHORT TERM GOAL #2   Title Pt will verbalize max pain <=5/10    Baseline up to 7-8/10 at eval   Time 2   Period Weeks   Status New  PT Long Term Goals - 05/27/16 1318      PT LONG TERM GOAL #1   Title Pt will be able to sleep without being woken by neck pain by 12/1   Baseline wakes multiple times at eval   Time 6   Period Weeks   Status New     PT LONG TERM GOAL #2   Title Resolution of HA pain to decrease effects of pain on daily activities   Baseline frequent HA at eval   Time 6   Period Weeks   Status New     PT LONG TERM GOAL #3   Title FOTO to 60% to indicate significant functional improvement   Baseline 45% at eval   Time 6   Period Weeks   Status New     PT LONG TERM GOAL #4   Title Max daily pain <=3/10 with work and care taking activities to improve function and effectiveness in activities through her day   Baseline 7-8/10 at eval   Time 6   Period Weeks   Status New               Plan - 06/05/16 1419    Clinical Impression Statement Significant fatigue noted in periscapular region with exercises today but no increase in  pain. Improved posture and ability to sit upright when wearing sports bra. Notable restriction at L upper cervical facets in closing, improved with treatment.    PT Next Visit Plan periscapular strength/endurance, DN- subocc, sclenes, levator   Consulted and Agree with Plan of Care Patient      Patient will benefit from skilled therapeutic intervention in order to improve the following deficits and impairments:     Visit Diagnosis: Cervicalgia  Muscle weakness (generalized)     Problem List Patient Active Problem List   Diagnosis Date Noted  . CONSTIPATION 07/10/2010   Honore Wipperfurth C. Murriel Eidem PT, DPT 06/05/16 2:22 PM   [facility] [address] [place], [place], [address] Phone: [phone]   Fax:  [phone]  Name: OKLA ALBOR MRN: [id number] Date of Birth: 10-27-1960

## 2016-06-10 ENCOUNTER — Ambulatory Visit: Payer: 59 | Admitting: Physical Therapy

## 2016-06-10 DIAGNOSIS — M6281 Muscle weakness (generalized): Secondary | ICD-10-CM

## 2016-06-10 DIAGNOSIS — M542 Cervicalgia: Secondary | ICD-10-CM

## 2016-06-10 NOTE — Therapy (Signed)
[place], [address] Phone: [phone]   Fax:  [phone]  Physical Therapy Treatment  Patient Details  Name: Barbara [place] MRN: [id number] Date of Birth: 1961-04-18 Referring Provider: Pete Pelt Pa-C  Encounter Date: 06/10/2016      PT End of Session - 06/10/16 1420    Visit Number 4   Number of Visits 13   Date for PT Re-Evaluation 07/12/16   PT Start Time 1331   PT Stop Time 1430   PT Time Calculation (min) 59 min   Activity Tolerance Patient tolerated treatment well   Behavior During Therapy [place] for tasks assessed/performed      Past Medical History:  Diagnosis Date  . Abdominal bloating   . Diverticulosis    history of  . Gas    at times  . Hx of abnormal cervical Pap smear     Past Surgical History:  Procedure Laterality Date  . ABDOMINAL HYSTERECTOMY     has part of one ovary  . BREAST ENHANCEMENT SURGERY    . CLAVICLE SURGERY     left side/had pins removed after a fracture  . hx coloposcopy      There were no vitals filed for this visit.      Subjective Assessment - 06/10/16 1335    Subjective "I am feeling sore today and I am unsure of why"    Currently in Pain? Yes   Pain Score 5    Pain Location Neck   Pain Orientation Right   Pain Descriptors / Indicators Aching   Pain Type Chronic pain   Pain Onset More than a month ago   Pain Frequency Constant   Aggravating Factors  driving, prolong standing, sitting, sleeping   Pain Relieving Factors muscle relaxers, heating pad,                          [place]bara [place] - 06/10/16 0001      Modalities   Modalities Moist Heat     Moist Heat Therapy   Number Minutes Moist Heat 10 Minutes   Moist Heat Location Shoulder     Manual Therapy   Manual Therapy Taping   Joint Mobilization prone thoracic PA T1-T7,  R first rib mob grade 4 with pt breathing in/out   Soft  tissue mobilization IASTM over R upper trap/ levator scapulae  and scalenes,    McConnell inhibition taping over R upper trap          Trigger Point Dry Needling - 06/10/16 1338    Consent Given? Yes   Education Handout Provided Yes   Muscles Treated Upper Body Upper trapezius;Levator scapulae  Scalenes   Upper Trapezius Response Twitch reponse elicited;Palpable increased muscle length   Levator Scapulae Response Twitch response elicited;Palpable increased muscle length              PT Education - 06/10/16 1419    Education provided Yes   Education Details Anatomy regarding foramtion of trigger points and referral of muscles. benefits of trigger point DN, what to expect and after care. benefits of inhibition taping and length of wear.    Person(s) Educated Patient   Methods Explanation;Verbal cues   Comprehension Verbalized understanding;Verbal cues required          PT Short Term Goals - 05/27/16 1317      PT SHORT TERM GOAL #1   Title Pt will  demo proper lifting to transfer and aid mom in standing without increase in neck pain by 11/3   Baseline educated at eval   Time 2   Period Weeks   Status New     PT SHORT TERM GOAL #2   Title Pt will verbalize max pain <=5/10    Baseline up to 7-8/10 at eval   Time 2   Period Weeks   Status New           PT Long Term Goals - 05/27/16 1318      PT LONG TERM GOAL #1   Title Pt will be able to sleep without being woken by neck pain by 12/1   Baseline wakes multiple times at eval   Time 6   Period Weeks   Status New     PT LONG TERM GOAL #2   Title Resolution of HA pain to decrease effects of pain on daily activities   Baseline frequent HA at eval   Time 6   Period Weeks   Status New     PT LONG TERM GOAL #3   Title FOTO to 60% to indicate significant functional improvement   Baseline 45% at eval   Time 6   Period Weeks   Status New     PT LONG TERM GOAL #4   Title Max daily pain <=3/10 with work and  care taking activities to improve function and effectiveness in activities through her day   Baseline 7-8/10 at eval   Time [place] - 06/10/16 1514    Clinical Impression Statement [name] continues to report pain rated at 10/10. DN was explained and peformed on her R upper trap, levator scapulae, and Scalenes; followed with IASTM and myosfascial release techniques. she reported decreased tightness,  she reported decreased pain and tightness and reduce HA post session.    PT Next Visit Plan assess response to DN/ inhibition taping, periscapular strength/endurance, thoracic mobility exercises,    Consulted and Agree with Plan of Care Patient      Patient will benefit from skilled therapeutic intervention in order to improve the following deficits and impairments:  Pain, Improper body mechanics, Postural dysfunction, Decreased strength, Decreased activity tolerance, Increased muscle spasms  Visit Diagnosis: Cervicalgia  Muscle weakness (generalized)     Problem List Patient Active Problem List   Diagnosis Date Noted  . CONSTIPATION 07/10/2010   Barbara [place] PT, DPT, LAT, ATC  06/10/16  3:18 PM      [facility] [address] [place], [place], [address] Phone: [phone]   Fax:  [phone]  Name: Barbara [place] MRN: [id number] Date of Birth: May 22, 1961

## 2016-06-12 ENCOUNTER — Ambulatory Visit: Payer: 59 | Attending: Physician Assistant | Admitting: Physical Therapy

## 2016-06-12 ENCOUNTER — Encounter: Payer: Self-pay | Admitting: Physical Therapy

## 2016-06-12 DIAGNOSIS — M542 Cervicalgia: Secondary | ICD-10-CM | POA: Diagnosis present

## 2016-06-12 DIAGNOSIS — M6281 Muscle weakness (generalized): Secondary | ICD-10-CM | POA: Diagnosis present

## 2016-06-12 NOTE — Therapy (Addendum)
[place], [place], [address] Phone: [phone]   Fax:  [phone]  Physical Therapy Treatment/Discharge Summary  Patient Details  Name: Barbara [place] MRN: [id number] Date of Birth: 1961/03/17 Referring Provider: Pete Pelt Pa-C  Encounter Date: 06/12/2016      PT End of Session - 06/12/16 1333    Visit Number 5   Number of Visits 13   Date for PT Re-Evaluation 07/12/16   Authorization Type UHC- 60 visit limit   PT Start Time 1333   PT Stop Time 1411   PT Time Calculation (min) 38 min   Activity Tolerance Patient tolerated treatment well   Behavior During Therapy [facility] for tasks assessed/performed      Past Medical History:  Diagnosis Date  . Abdominal bloating   . Diverticulosis    history of  . Gas    at times  . Hx of abnormal cervical Pap smear     Past Surgical History:  Procedure Laterality Date  . ABDOMINAL HYSTERECTOMY     has part of one ovary  . BREAST ENHANCEMENT SURGERY    . CLAVICLE SURGERY     left side/had pins removed after a fracture  . hx coloposcopy      There were no vitals filed for this visit.      Subjective Assessment - 06/12/16 1333    Subjective Pt reports feeling sore after needling but feeling better. Denies HA pain today.    Patient Stated Goals sleep, get up in the morning, work, caretaking   Currently in Pain? Yes   Pain Score 2    Pain Location Neck   Pain Orientation Right   Pain Descriptors / Indicators Sore            [facility] PT Assessment - 06/12/16 0001      AROM   Cervical Flexion 55   Cervical Extension 35   Cervical - Right Side Bend 35   Cervical - Left Side Bend 35     Strength   Right Shoulder Flexion 4-/5   Right Shoulder Internal Rotation 4+/5   Right Shoulder External Rotation 4/5   Left Shoulder Flexion 4-/5   Left Shoulder External Rotation 4/5                     [facility] Adult PT  Treatment/Exercise - 06/12/16 0001      Neck Exercises: Machines for Strengthening   UBE (Upper Arm Bike) L1 retro 3'     Shoulder Exercises: Seated   Other Seated Exercises seated lat press     Shoulder Exercises: Prone   Other Prone Exercises quadruped row green tband     Shoulder Exercises: Standing   ABduction Limitations hands behind back hinge forward yellow tband   Retraction Limitations visual and tactile cuing     Shoulder Exercises: Stretch   Other Shoulder Stretches door pec stretch                  PT Short Term Goals - 06/12/16 1344      PT SHORT TERM GOAL #1   Title Pt will demo proper lifting to transfer and aid mom in standing without increase in neck pain by 11/3   Baseline still has a tendency to elevate shoulders when lifting and pulling   Status On-going     PT SHORT TERM GOAL #2   Title Pt will verbalize max pain <=5/10    Status  Achieved           PT Long Term Goals - 05/27/16 1318      PT LONG TERM GOAL #1   Title Pt will be able to sleep without being woken by neck pain by 12/1   Baseline wakes multiple times at eval   Time 6   Period Weeks   Status New     PT LONG TERM GOAL #2   Title Resolution of HA pain to decrease effects of pain on daily activities   Baseline frequent HA at eval   Time 6   Period Weeks   Status New     PT LONG TERM GOAL #3   Title FOTO to 60% to indicate significant functional improvement   Baseline 45% at eval   Time 6   Period Weeks   Status New     PT LONG TERM GOAL #4   Title Max daily pain <=3/10 with work and care taking activities to improve function and effectiveness in activities through her day   Baseline 7-8/10 at eval   Time 6   Period Weeks   Status New               Plan - 06/12/16 1413    Clinical Impression Statement Heavy verbal, tactile and visual cuing required for proper retraction of scapulae during exercises today. Pt did well with DN and would like to have it  again, reported decrease in concordant pain. Pt is having surgical intervention next week and will be d/c next week.    PT Next Visit Plan d/c or return for DN prior to surgery. FOTO, goals   Consulted and Agree with Plan of Care Patient      Patient will benefit from skilled therapeutic intervention in order to improve the following deficits and impairments:     Visit Diagnosis: Cervicalgia  Muscle weakness (generalized)     Problem List Patient Active Problem List   Diagnosis Date Noted  . CONSTIPATION 07/10/2010   Shelsea Hangartner C. Carvel Huskins PT, DPT 06/12/16 2:16 PM   [facility] [address] [place], [place], [address] Phone: [phone]   Fax:  [phone]  Name: Barbara [place] MRN: [id number] Date of Birth: Sep 09, 1960  PHYSICAL THERAPY DISCHARGE SUMMARY  Visits from Start of Care: 5  Current functional level related to goals / functional outcomes: See above   Remaining deficits: See above   Education / Equipment: Anatomy of condition, POC, HEP, exercise form/rationale  Plan: Patient agrees to discharge.  Patient goals were not met. Patient is being discharged due to a change in medical status.  ?????     Pt is having surgical intervention for breast reduction and will return to PT in the future PRN.   Laster Appling C. Cj Edgell PT, DPT 06/17/16 2:00 PM

## 2016-06-13 ENCOUNTER — Ambulatory Visit (INDEPENDENT_AMBULATORY_CARE_PROVIDER_SITE_OTHER): Payer: 59 | Admitting: Orthopaedic Surgery

## 2016-06-17 ENCOUNTER — Ambulatory Visit: Payer: 59 | Admitting: Physical Therapy

## 2016-06-19 ENCOUNTER — Ambulatory Visit: Payer: 59 | Admitting: Physical Therapy

## 2016-06-24 ENCOUNTER — Ambulatory Visit: Payer: 59 | Admitting: Physical Therapy

## 2016-06-26 ENCOUNTER — Encounter: Payer: 59 | Admitting: Physical Therapy

## 2016-07-01 ENCOUNTER — Encounter: Payer: 59 | Admitting: Physical Therapy

## 2016-07-03 ENCOUNTER — Encounter: Payer: 59 | Admitting: Physical Therapy

## 2016-07-15 ENCOUNTER — Encounter: Payer: 59 | Admitting: Physical Therapy

## 2016-07-17 ENCOUNTER — Encounter: Payer: 59 | Admitting: Physical Therapy

## 2017-04-08 ENCOUNTER — Encounter: Payer: 59 | Admitting: Obstetrics and Gynecology

## 2017-08-14 ENCOUNTER — Ambulatory Visit (INDEPENDENT_AMBULATORY_CARE_PROVIDER_SITE_OTHER): Payer: 59

## 2017-08-14 ENCOUNTER — Encounter (INDEPENDENT_AMBULATORY_CARE_PROVIDER_SITE_OTHER): Payer: Self-pay | Admitting: Physician Assistant

## 2017-08-14 ENCOUNTER — Ambulatory Visit (INDEPENDENT_AMBULATORY_CARE_PROVIDER_SITE_OTHER): Payer: 59 | Admitting: Physician Assistant

## 2017-08-14 DIAGNOSIS — M25572 Pain in left ankle and joints of left foot: Secondary | ICD-10-CM | POA: Diagnosis not present

## 2017-08-14 MED ORDER — DICLOFENAC SODIUM 1 % TD GEL: 4.0000 g | Freq: Three times a day (TID) | TRANSDERMAL | Status: DC

## 2017-08-14 NOTE — Progress Notes (Signed)
Office Visit Note   Patient: Barbara [place]           Date of Birth: 1961/06/29           MRN: [id number] Visit Date: 08/14/2017              Requested by: Aura Dials, [address] [place], [place] [address] PCP: Aura Dials, MD   Assessment & Plan: Visit Diagnoses:  1. Left lateral ankle pain     Plan: Discussed with her conservative measures for Morton's neuroma which include topical anti-inflammatories wide toe box shoe.  Also discussed cortisone and injection.  At this point time she would like to stay with most conservative measures I agree with this.  Therefore we will call in some Voltaren gel for.  Should be careful about her shoe wear.  She will follow-up if pain persist or becomes worse.  Follow-Up Instructions: Return if symptoms worsen or fail to improve.   Orders:  Orders Placed This Encounter  Procedures  . XR Foot 2 Views Left   Meds ordered this encounter  Medications  . diclofenac sodium (VOLTAREN) 1 % transdermal gel 4 g      Procedures: No procedures performed   Clinical Data: No additional findings.   Subjective: Left foot pain  HPI Barbara [place] is a 57 year old female who are seen for left foot pain.  States at times her foot feels inflamed and has like shooting nerve pain in it.  Pain is mostly lateral aspect of the foot.  She did have an injury to her foot back in May had an fall in her attic.  She had some pain with ambulating at that time did not seek medical care pain dissipated and went away.  However this Christmas season she began having pain in her foot mostly lateral aspect.  States the other day she could barely walk due to the pain.  Review of Systems No fevers chills shortness of breath chest pain.  Objective: Vital Signs: There were no vitals taken for this visit.  Physical Exam  Constitutional: She is oriented to person, place, and time. She appears well-developed and well-nourished. No distress.  Cardiovascular:  Intact distal pulses.  Pulmonary/Chest: Effort normal.  Neurological: She is alert and oriented to person, place, and time.  Skin: She is not diaphoretic.  Psychiatric: She has a normal mood and affect.    Ortho Exam Bilateral feet no rashes skin lesions ulcerations or impending ulcers.  Sensation intact bilateral feet to light touch.  No paresthesias.  She has tenderness maximally at the third webspace of the left foot.  Positive Mulder's click.  Compression of the left foot from medial lateral reproduces her pain.  Nontender over the medial tubercle of the calcaneus.  Nontender over the Achilles on the left.  Achilles is intact on the left.  Left calf supple nontender.  She has full range of motion of bilateral ankles without pain.  Inversion eversion bilateral feet against resistance causes no pain.  She is nontender of the posterior tibial tendon peroneal tendons of the left foot. Specialty Comments:  No specialty comments available.  Imaging: Xr Foot 2 Views Left  Result Date: 08/14/2017 AP lateral views left foot: No acute fracture no bony abnormalities.  Morton's type foot with the second metatarsal being longer than the first.  The fibular sesamoid is bipartite.  Lisfranc joints well maintained.    PMFS History: Patient Active Problem List   Diagnosis Date Noted  .  CONSTIPATION 07/10/2010   Past Medical History:  Diagnosis Date  . Abdominal bloating   . Diverticulosis    history of  . Gas    at times  . Hx of abnormal cervical Pap smear     Family History  Problem Relation Age of Onset  . Dementia Mother   . Diabetes Mother   . Pancreatic cancer Sister     Past Surgical History:  Procedure Laterality Date  . ABDOMINAL HYSTERECTOMY     has part of one ovary  . BREAST ENHANCEMENT SURGERY    . CLAVICLE SURGERY     left side/had pins removed after a fracture  . hx coloposcopy     Social History   Occupational History  . Not on file  Tobacco Use  . Smoking  status: Never Smoker  . Smokeless tobacco: Never Used  Substance and Sexual Activity  . Alcohol use: Yes    Alcohol/week: 1.2 oz    Types: 2 Cans of beer per week  . Drug use: No  . Sexual activity: Yes    Partners: Male    Birth control/protection: None

## 2018-01-27 ENCOUNTER — Encounter: Payer: Self-pay | Admitting: Family Medicine

## 2018-01-27 ENCOUNTER — Ambulatory Visit (INDEPENDENT_AMBULATORY_CARE_PROVIDER_SITE_OTHER): Payer: 59 | Admitting: Family Medicine

## 2018-01-27 ENCOUNTER — Other Ambulatory Visit: Payer: Self-pay

## 2018-01-27 VITALS — BP 120/74 | HR 62 | Temp 97.6°F | Resp 16 | Ht 67.0 in | Wt 157.4 lb

## 2018-01-27 DIAGNOSIS — S20212A Contusion of left front wall of thorax, initial encounter: Secondary | ICD-10-CM | POA: Diagnosis not present

## 2018-01-27 DIAGNOSIS — Z7689 Persons encountering health services in other specified circumstances: Secondary | ICD-10-CM | POA: Diagnosis not present

## 2018-01-27 NOTE — Assessment & Plan Note (Signed)
Continues to deal with Trial of linzess

## 2018-01-27 NOTE — Progress Notes (Signed)
Patient ID: Barbara Bradford, female    DOB: Apr 05, 1961, 57 y.o.   MRN: [id number]  PCP: Delsa Grana, PA-C  Chief Complaint  Patient presents with  . Establish Care    Has c/o left rib cage    Subjective:   Barbara Bradford is a 57 y.o. female, presents to clinic with CC of left rib contusion 4 days ago, and is here to establish care.  She was previously seen at [facility].  She had difficulty seeing the same provider and desired to change practices.    She was working out at [employer] 4 days ago, when she fell and rolled slightly hittiing her left anterior lower ribs on two rolling pins.  She heard a crack.  She has 4/10 intermittent pain, described as sore and achy, exacerbated with sneezing and with certain twisting movements, alleviated with rest and with NSAIDs.  She has no swelling, bruising, cough, SOB inspiratory chest pain.  Pain I located left anterior lower ribs and radiates down left side of abdomen towards hip.  Is also reproducible with palpation.    Patient has a history of constipation and sensitivity towards stool softeners and laxatives.  She states that she deals with constipation until she has severe constipation and abdominal pain she will take MiraLAX to try and clear herself out.  But if she takes MiraLAX daily she has excessive abdominal bloating and flatulence, so she has continued to deal with the intermittent constipation issues.  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    She believes she has had her complete physical exam done within the last year with her physicians [facility].  She does her Pap smears with Dr. Enzo Bi.  Has no other acute issues at this time, does not request any medication refills or management at this time.  PMHx, family hx, social hx, surgical hx all reviewed, as indicated below.  Patient Active Problem List   Diagnosis Date  Noted  . CONSTIPATION 07/10/2010     Prior to Admission medications   Medication Sig Start Date End Date Taking? Authorizing Provider  Wheat Dextrin (BENEFIBER) POWD Use tablespoon three times per day one bottle 350 GMS Patient not taking: Reported on 01/27/2018 01/23/16   Mauri Pole, MD     No Known Allergies   Family History  Problem Relation Age of Onset  . Dementia Mother   . Diabetes Mother   . Stroke Father   . Pancreatic cancer Sister      Social History   Socioeconomic History  . Marital status: Married    Spouse name: Not on file  . Number of children: Not on file  . Years of education: Not on file  . Highest education level: Not on file  Occupational History  . Not on file  Social Needs  . Financial resource strain: Not on file  . Food insecurity:    Worry: Not on file    Inability: Not on file  . Transportation needs:    Medical: Not on file    Non-medical: Not on file  Tobacco Use  . Smoking status: Never Smoker  . Smokeless tobacco: Never Used  Substance and Sexual Activity  . Alcohol use: Yes    Alcohol/week: 1.2 oz    Types: 2 Cans of beer per week  . Drug use: No  . Sexual activity: Yes  Partners: Male    Birth control/protection: None  Lifestyle  . Physical activity:    Days per week: 4 days    Minutes per session: 90 min  . Stress: Not on file  Relationships  . Social connections:    Talks on phone: Not on file    Gets together: Not on file    Attends religious service: Not on file    Active member of club or organization: Not on file    Attends meetings of clubs or organizations: Not on file    Relationship status: Not on file  . Intimate partner violence:    Fear of current or ex partner: Not on file    Emotionally abused: Not on file    Physically abused: Not on file    Forced sexual activity: Not on file  Other Topics Concern  . Not on file  Social History Narrative  . Not on file     Review of Systems    Constitutional: Negative.  Negative for activity change, appetite change, chills, diaphoresis, fatigue, fever and unexpected weight change.  HENT: Negative.   Eyes: Negative.   Respiratory: Negative.  Negative for apnea, cough, choking, chest tightness, shortness of breath, wheezing and stridor.   Cardiovascular: Negative.  Negative for palpitations and leg swelling.  Gastrointestinal: Negative.   Endocrine: Negative.   Genitourinary: Negative.   Musculoskeletal: Negative.  Negative for arthralgias, back pain, gait problem, joint swelling, neck pain and neck stiffness.  Skin: Negative.  Negative for color change and pallor.  Allergic/Immunologic: Negative.   Neurological: Negative.   Hematological: Negative.  Negative for adenopathy. Does not bruise/bleed easily.  Psychiatric/Behavioral: Negative.   All other systems reviewed and are negative.      Objective:    Vitals:   01/27/18 1408  BP: 120/74  Pulse: 62  Resp: 16  Temp: 97.6 F (36.4 C)  TempSrc: Oral  SpO2: 99%  Weight: 157 lb 6 oz (71.4 kg)  Height: 5\' 7"  (1.702 m)      Physical Exam  Constitutional: She is oriented to person, place, and time. She appears well-developed and well-nourished.  Non-toxic appearance. No distress.  HENT:  Head: Normocephalic and atraumatic.  Right Ear: External ear normal.  Left Ear: External ear normal.  Nose: Nose normal.  Mouth/Throat: Uvula is midline, oropharynx is clear and moist and mucous membranes are normal.  Eyes: Pupils are equal, round, and reactive to light. Conjunctivae, EOM and lids are normal.  Neck: Normal range of motion and phonation normal. Neck supple. No tracheal deviation present. No thyromegaly present.  Cardiovascular: Normal rate, regular rhythm, normal heart sounds, intact distal pulses and normal pulses. Exam reveals no gallop and no friction rub.  No murmur heard. Pulses:      Radial pulses are 2+ on the right side, and 2+ on the left side.        Posterior tibial pulses are 2+ on the right side, and 2+ on the left side.  Pulmonary/Chest: Effort normal and breath sounds normal. No accessory muscle usage or stridor. No respiratory distress. She has no decreased breath sounds. She has no wheezes. She has no rhonchi. She has no rales. Chest wall is not dull to percussion. She exhibits tenderness and bony tenderness. She exhibits no crepitus, no edema, no deformity, no swelling and no retraction.    Abdominal: Soft. Normal appearance and bowel sounds are normal. She exhibits no distension and no mass. There is no tenderness. There is no rebound and  no guarding.  Musculoskeletal: Normal range of motion. She exhibits no edema or deformity.  Lymphadenopathy:    She has no cervical adenopathy.  Neurological: She is alert and oriented to person, place, and time. She exhibits normal muscle tone. Gait normal.  Skin: Skin is warm, dry and intact. Capillary refill takes less than 2 seconds. No rash noted. She is not diaphoretic. No pallor.  Psychiatric: She has a normal mood and affect. Her speech is normal and behavior is normal. Judgment and thought content normal.  Nursing note and vitals reviewed.         Assessment & Plan:      ICD-10-CM   1. Contusion of rib on left side, initial encounter S20.212A    Continue NSAIDs, encourage deep inspiration, avoid activity that causes pain.  Contact me for follow-up if having any new symptoms, fever, cough, SOB  2. Encounter to establish care with new doctor Z76.89    Obtaining records from [place] that occurred a few days ago with a short distance "fall" less than a foot, with inspection no visible signs of injury, no flail chest, bruising, swelling, mild tenderness to palpation over left lower anterior ribs, lungs are clear to auscultation, she has no concerning symptoms.  I did offer left rib x-ray but do not feel its absolutely indicated.  Educated about prevention of pneumonia  after rib injury.  She states her pain is managed with NSAIDs, continue to do this until improved.  Here to establish at our office.  She does not believe that she is due for a complete physical yet having had one in the last year.  Will obtain records.  She also is established with Dr. Enzo Bi for her OB/GYN care and prefers to do Pap evaluations with him.    She does suffer from some chronic abdominal sensitivities, seems consistent with IBS, alternates diarrhea and constipation with a lot of indigestion and gassiness.  Is not due for colonoscopy until 2027.  Will give sample of Linzess.   (pt did not get - will notify her she can pick up.)  F/up for CPE when due, will update chart when we receive records and schedule CPE.   Delsa Grana, PA-C 01/28/18 2:53 PM

## 2018-01-27 NOTE — Patient Instructions (Signed)
Call me if you have any worsening symptoms with your ribs or abs.  Also call me if you have any breathing issues, coughing, new fever.    I will call you and we'll decide follow up for routine care and labs as soon as I get your records.    BMI today is 24.7 :)  Health Maintenance, Female Adopting a healthy lifestyle and getting preventive care can go a long way to promote health and wellness. Talk with your health care provider about what schedule of regular examinations is right for you. This is a good chance for you to check in with your provider about disease prevention and staying healthy. In between checkups, there are plenty of things you can do on your own. Experts have done a lot of research about which lifestyle changes and preventive measures are most likely to keep you healthy. Ask your health care provider for more information. Weight and diet Eat a healthy diet  Be sure to include plenty of vegetables, fruits, low-fat dairy products, and lean protein.  Do not eat a lot of foods high in solid fats, added sugars, or salt.  Get regular exercise. This is one of the most important things you can do for your health. ? Most adults should exercise for at least 150 minutes each week. The exercise should increase your heart rate and make you sweat (moderate-intensity exercise). ? Most adults should also do strengthening exercises at least twice a week. This is in addition to the moderate-intensity exercise.  Maintain a healthy weight  Body mass index (BMI) is a measurement that can be used to identify possible weight problems. It estimates body fat based on height and weight. Your health care provider can help determine your BMI and help you achieve or maintain a healthy weight.  For females 31 years of age and older: ? A BMI below 18.5 is considered underweight. ? A BMI of 18.5 to 24.9 is normal. ? A BMI of 25 to 29.9 is considered overweight. ? A BMI of 30 and above is considered  obese.  Watch levels of cholesterol and blood lipids  You should start having your blood tested for lipids and cholesterol at 55 years of age, then have this test every 5 years.  You may need to have your cholesterol levels checked more often if: ? Your lipid or cholesterol levels are high. ? You are older than 55 years of age. ? You are at high risk for heart disease.  Cancer screening Lung Cancer  Lung cancer screening is recommended for adults 7-54 years old who are at high risk for lung cancer because of a history of smoking.  A yearly low-dose CT scan of the lungs is recommended for people who: ? Currently smoke. ? Have quit within the past 15 years. ? Have at least a 30-pack-year history of smoking. A pack year is smoking an average of one pack of cigarettes a day for 1 year.  Yearly screening should continue until it has been 15 years since you quit.  Yearly screening should stop if you develop a health problem that would prevent you from having lung cancer treatment.  Breast Cancer  Practice breast self-awareness. This means understanding how your breasts normally appear and feel.  It also means doing regular breast self-exams. Let your health care provider know about any changes, no matter how small.  If you are in your 20s or 30s, you should have a clinical breast exam (CBE) by a health  care provider every 1-3 years as part of a regular health exam.  If you are 88 or older, have a CBE every year. Also consider having a breast X-ray (mammogram) every year.  If you have a family history of breast cancer, talk to your health care provider about genetic screening.  If you are at high risk for breast cancer, talk to your health care provider about having an MRI and a mammogram every year.  Breast cancer gene (BRCA) assessment is recommended for women who have family members with BRCA-related cancers. BRCA-related cancers  include: ? Breast. ? Ovarian. ? Tubal. ? Peritoneal cancers.  Results of the assessment will determine the need for genetic counseling and BRCA1 and BRCA2 testing.  Cervical Cancer Your health care provider may recommend that you be screened regularly for cancer of the pelvic organs (ovaries, uterus, and vagina). This screening involves a pelvic examination, including checking for microscopic changes to the surface of your cervix (Pap test). You may be encouraged to have this screening done every 3 years, beginning at age 67.  For women ages 15-65, health care providers may recommend pelvic exams and Pap testing every 3 years, or they may recommend the Pap and pelvic exam, combined with testing for human papilloma virus (HPV), every 5 years. Some types of HPV increase your risk of cervical cancer. Testing for HPV may also be done on women of any age with unclear Pap test results.  Other health care providers may not recommend any screening for nonpregnant women who are considered low risk for pelvic cancer and who do not have symptoms. Ask your health care provider if a screening pelvic exam is right for you.  If you have had past treatment for cervical cancer or a condition that could lead to cancer, you need Pap tests and screening for cancer for at least 20 years after your treatment. If Pap tests have been discontinued, your risk factors (such as having a new sexual partner) need to be reassessed to determine if screening should resume. Some women have medical problems that increase the chance of getting cervical cancer. In these cases, your health care provider may recommend more frequent screening and Pap tests.  Colorectal Cancer  This type of cancer can be detected and often prevented.  Routine colorectal cancer screening usually begins at 55 years of age and continues through 55 years of age.  Your health care provider may recommend screening at an earlier age if you have risk factors  for colon cancer.  Your health care provider may also recommend using home test kits to check for hidden blood in the stool.  A small camera at the end of a tube can be used to examine your colon directly (sigmoidoscopy or colonoscopy). This is done to check for the earliest forms of colorectal cancer.  Routine screening usually begins at age 21.  Direct examination of the colon should be repeated every 5-10 years through 55 years of age. However, you may need to be screened more often if early forms of precancerous polyps or small growths are found.  Skin Cancer  Check your skin from head to toe regularly.  Tell your health care provider about any new moles or changes in moles, especially if there is a change in a mole's shape or color.  Also tell your health care provider if you have a mole that is larger than the size of a pencil eraser.  Always use sunscreen. Apply sunscreen liberally and repeatedly throughout the  day.  Protect yourself by wearing long sleeves, pants, a wide-brimmed hat, and sunglasses whenever you are outside.  Heart disease, diabetes, and high blood pressure  High blood pressure causes heart disease and increases the risk of stroke. High blood pressure is more likely to develop in: ? People who have blood pressure in the high end of the normal range (130-139/85-89 mm Hg). ? People who are overweight or obese. ? People who are African American.  If you are 33-25 years of age, have your blood pressure checked every 3-5 years. If you are 54 years of age or older, have your blood pressure checked every year. You should have your blood pressure measured twice-once when you are at a hospital or clinic, and once when you are not at a hospital or clinic. Record the average of the two measurements. To check your blood pressure when you are not at a hospital or clinic, you can use: ? An automated blood pressure machine at a pharmacy. ? A home blood pressure monitor.  If  you are between 59 years and 73 years old, ask your health care provider if you should take aspirin to prevent strokes.  Have regular diabetes screenings. This involves taking a blood sample to check your fasting blood sugar level. ? If you are at a normal weight and have a low risk for diabetes, have this test once every three years after 55 years of age. ? If you are overweight and have a high risk for diabetes, consider being tested at a younger age or more often. Preventing infection Hepatitis B  If you have a higher risk for hepatitis B, you should be screened for this virus. You are considered at high risk for hepatitis B if: ? You were born in a country where hepatitis B is common. Ask your health care provider which countries are considered high risk. ? Your parents were born in a high-risk country, and you have not been immunized against hepatitis B (hepatitis B vaccine). ? You have HIV or AIDS. ? You use needles to inject street drugs. ? You live with someone who has hepatitis B. ? You have had sex with someone who has hepatitis B. ? You get hemodialysis treatment. ? You take certain medicines for conditions, including cancer, organ transplantation, and autoimmune conditions.  Hepatitis C  Blood testing is recommended for: ? Everyone born from [birth year] through 1963. ? Anyone with known risk factors for hepatitis C.  Sexually transmitted infections (STIs)  You should be screened for sexually transmitted infections (STIs) including gonorrhea and chlamydia if: ? You are sexually active and are younger than 55 years of age. ? You are older than 55 years of age and your health care provider tells you that you are at risk for this type of infection. ? Your sexual activity has changed since you were last screened and you are at an increased risk for chlamydia or gonorrhea. Ask your health care provider if you are at risk.  If you do not have HIV, but are at risk, it may be recommended  that you take a prescription medicine daily to prevent HIV infection. This is called pre-exposure prophylaxis (PrEP). You are considered at risk if: ? You are sexually active and do not regularly use condoms or know the HIV status of your partner(s). ? You take drugs by injection. ? You are sexually active with a partner who has HIV.  Talk with your health care provider about whether you are at high  risk of being infected with HIV. If you choose to begin PrEP, you should first be tested for HIV. You should then be tested every 3 months for as long as you are taking PrEP. Pregnancy  If you are premenopausal and you may become pregnant, ask your health care provider about preconception counseling.  If you may become pregnant, take 400 to 800 micrograms (mcg) of folic acid every day.  If you want to prevent pregnancy, talk to your health care provider about birth control (contraception). Osteoporosis and menopause  Osteoporosis is a disease in which the bones lose minerals and strength with aging. This can result in serious bone fractures. Your risk for osteoporosis can be identified using a bone density scan.  If you are 70 years of age or older, or if you are at risk for osteoporosis and fractures, ask your health care provider if you should be screened.  Ask your health care provider whether you should take a calcium or vitamin D supplement to lower your risk for osteoporosis.  Menopause may have certain physical symptoms and risks.  Hormone replacement therapy may reduce some of these symptoms and risks. Talk to your health care provider about whether hormone replacement therapy is right for you. Follow these instructions at home:  Schedule regular health, dental, and eye exams.  Stay current with your immunizations.  Do not use any tobacco products including cigarettes, chewing tobacco, or electronic cigarettes.  If you are pregnant, do not drink alcohol.  If you are  breastfeeding, limit how much and how often you drink alcohol.  Limit alcohol intake to no more than 1 drink per day for nonpregnant women. One drink equals 12 ounces of beer, 5 ounces of wine, or 1 ounces of hard liquor.  Do not use street drugs.  Do not share needles.  Ask your health care provider for help if you need support or information about quitting drugs.  Tell your health care provider if you often feel depressed.  Tell your health care provider if you have ever been abused or do not feel safe at home. This information is not intended to replace advice given to you by your health care provider. Make sure you discuss any questions you have with your health care provider. Document Released: 02/11/2011 Document Revised: 01/04/2016 Document Reviewed: 05/02/2015 Elsevier Interactive Patient Education  [employer].

## 2018-04-16 ENCOUNTER — Other Ambulatory Visit: Payer: Self-pay

## 2018-04-16 ENCOUNTER — Encounter: Payer: Self-pay | Admitting: Family Medicine

## 2018-04-16 ENCOUNTER — Ambulatory Visit (INDEPENDENT_AMBULATORY_CARE_PROVIDER_SITE_OTHER): Payer: 59 | Admitting: Family Medicine

## 2018-04-16 VITALS — BP 120/62 | HR 64 | Temp 97.9°F | Resp 14 | Ht 67.0 in | Wt 162.0 lb

## 2018-04-16 DIAGNOSIS — E785 Hyperlipidemia, unspecified: Secondary | ICD-10-CM

## 2018-04-16 DIAGNOSIS — E663 Overweight: Secondary | ICD-10-CM | POA: Diagnosis not present

## 2018-04-16 DIAGNOSIS — Z1231 Encounter for screening mammogram for malignant neoplasm of breast: Secondary | ICD-10-CM

## 2018-04-16 DIAGNOSIS — Z1212 Encounter for screening for malignant neoplasm of rectum: Secondary | ICD-10-CM

## 2018-04-16 DIAGNOSIS — Z0000 Encounter for general adult medical examination without abnormal findings: Secondary | ICD-10-CM | POA: Diagnosis not present

## 2018-04-16 DIAGNOSIS — G4700 Insomnia, unspecified: Secondary | ICD-10-CM | POA: Diagnosis not present

## 2018-04-16 DIAGNOSIS — Z1159 Encounter for screening for other viral diseases: Secondary | ICD-10-CM

## 2018-04-16 DIAGNOSIS — Z23 Encounter for immunization: Secondary | ICD-10-CM

## 2018-04-16 DIAGNOSIS — K5900 Constipation, unspecified: Secondary | ICD-10-CM | POA: Diagnosis not present

## 2018-04-16 DIAGNOSIS — Z114 Encounter for screening for human immunodeficiency virus [HIV]: Secondary | ICD-10-CM

## 2018-04-16 DIAGNOSIS — Z1239 Encounter for other screening for malignant neoplasm of breast: Secondary | ICD-10-CM

## 2018-04-16 MED ORDER — LINACLOTIDE 145 MCG PO CAPS: 145.0000 ug | ORAL_CAPSULE | Freq: Every day | ORAL | 2 refills | Status: DC

## 2018-04-16 MED ORDER — ZOSTER VAC RECOMB ADJUVANTED 50 MCG/0.5ML IM SUSR: 0.5000 mL | Freq: Once | INTRAMUSCULAR | 1 refills | Status: AC

## 2018-04-16 MED ORDER — ZOLPIDEM TARTRATE 5 MG PO TABS: 5.0000 mg | ORAL_TABLET | Freq: Every evening | ORAL | 0 refills | Status: DC | PRN

## 2018-04-16 NOTE — Progress Notes (Addendum)
Patient: Barbara Bradford, Female    DOB: [birth year], 57 y.o.   MRN: [id number] Visit Date: 04/16/2018  Today's Provider: Delsa Grana, PA-C   Chief Complaint  Patient presents with  . CPE    is fasting   Subjective:    Annual physical exam Barbara Bradford is a 57 y.o. female who presents today for health maintenance and complete physical. She feels well. She reports exercising daily at home. She reports she is sleeping poorly. Previously had sleeping problems sleeping after her son died and tried xanax which she is scared of.  Estimates 4 hours of sleep.  ----------------------------------------------------------------- Needs biometric screening for work. No labs done since 2017 She believes her cholesterol is high from her diet, but other is very healthy and active. PAP UTD Due for mammogram She has not yet had shingles vaccine and is due to Tdap booster and flu and wants to address all today.  Constipation - she was given samples of Linzess 145 and most morning she tried it it worked for her, and only once or twice did not produce a BM.  She experienced a little gas and bloating with the medicine, but otherwise it was more effective that all past tried OTC and prescription meds.  She would like to get a refill. Former smoker, quit 20 years ago.    2 children, one deceased  Insomnia  Primary symptoms: sleep disturbance, difficulty falling asleep, frequent awakening.  The current episode started more than one year. The onset quality is undetermined. The problem occurs nightly. The problem is unchanged. The symptoms are aggravated by work stress and bed partner (snoring husband). Nothing relieves the symptoms. Past treatments include medication (no improvement with melatonin, motrin pm meds). The treatment provided no relief. How long after going to bed to you fall asleep: over an hour.   PMH includes: associated symptoms present, no hypertension, no depression, no family stress  or anxiety, no restless leg syndrome, work related stressors, no chronic pain, no apnea.  Prior diagnostic workup includes:  No prior workup.     Review of Systems  Constitutional: Negative.  Negative for activity change, appetite change, fatigue and unexpected weight change.  HENT: Negative.   Eyes: Negative.   Respiratory: Negative.  Negative for apnea and shortness of breath.   Cardiovascular: Negative.  Negative for chest pain, palpitations and leg swelling.  Gastrointestinal: Positive for constipation. Negative for abdominal pain, blood in stool, diarrhea, nausea and vomiting.  Endocrine: Negative.   Genitourinary: Negative.   Musculoskeletal: Negative.  Negative for arthralgias, gait problem, joint swelling and myalgias.  Skin: Negative.  Negative for color change, pallor and rash.  Allergic/Immunologic: Negative.   Neurological: Negative.  Negative for syncope and weakness.  Hematological: Negative.   Psychiatric/Behavioral: Positive for sleep disturbance. Negative for agitation, behavioral problems, confusion, decreased concentration, depression, dysphoric mood, self-injury and suicidal ideas. The patient has insomnia. The patient is not nervous/anxious.   All other systems reviewed and are negative.   Social History      She  reports that she quit smoking about 21 years ago. Her smoking use included cigarettes. She has never used smokeless tobacco. She reports that she drinks about 2.0 standard drinks of alcohol per week. She reports that she does not use drugs.       Social History   Socioeconomic History  . Marital status: Married    Spouse name: Not on file  . Number of children: 2  . Years of education:  Not on file  . Highest education level: Not on file  Occupational History  . Not on file  Social Needs  . Financial resource strain: Not on file  . Food insecurity:    Worry: Not on file    Inability: Not on file  . Transportation needs:    Medical: Not on file     Non-medical: Not on file  Tobacco Use  . Smoking status: Former Smoker    Types: Cigarettes    Last attempt to quit: 04/16/1997    Years since quitting: 21.0  . Smokeless tobacco: Never Used  Substance and Sexual Activity  . Alcohol use: Yes    Alcohol/week: 2.0 standard drinks    Types: 2 Cans of beer per week  . Drug use: No  . Sexual activity: Yes    Partners: Male    Birth control/protection: None, Post-menopausal  Lifestyle  . Physical activity:    Days per week: 4 days    Minutes per session: 90 min  . Stress: Not on file  Relationships  . Social connections:    Talks on phone: Not on file    Gets together: Not on file    Attends religious service: Not on file    Active member of club or organization: Not on file    Attends meetings of clubs or organizations: Not on file    Relationship status: Not on file  Other Topics Concern  . Not on file  Social History Narrative  . Not on file    Past Medical History:  Diagnosis Date  . Abdominal bloating   . Diverticulosis    history of  . Gas    at times  . Hx of abnormal cervical Pap smear      Patient Active Problem List   Diagnosis Date Noted  . Constipation 07/10/2010    Past Surgical History:  Procedure Laterality Date  . ABDOMINAL HYSTERECTOMY     has part of one ovary  . BREAST ENHANCEMENT SURGERY    . CLAVICLE SURGERY     left side/had pins removed after a fracture  . hx coloposcopy      Family History        Family Status  Relation Name Status  . Mother  Alive  . Father  Alive  . Sister  Deceased       38  . Brother  Alive        Her family history includes Dementia in her mother; Diabetes in her mother; Pancreatic cancer in her sister; Stroke in her father.      No Known Allergies   Current Outpatient Medications:  .  linaclotide (LINZESS) 145 MCG CAPS capsule, Take 1 capsule (145 mcg total) by mouth daily before breakfast., Disp: 30 capsule, Rfl: 2 .  Wheat Dextrin (BENEFIBER)  POWD, Use tablespoon three times per day one bottle 350 GMS (Patient not taking: Reported on 01/27/2018), Disp: 350 g, Rfl: 10 .  zolpidem (AMBIEN) 5 MG tablet, Take 1 tablet (5 mg total) by mouth at bedtime as needed for sleep., Disp: 30 tablet, Rfl: 0 .  Zoster Vaccine Adjuvanted (SHINGRIX) injection, Inject 0.5 mLs into the muscle once for 1 dose. And repeat once more in 2-6 months, Disp: 0.5 mL, Rfl: 1  Current Facility-Administered Medications:  .  diclofenac sodium (VOLTAREN) 1 % transdermal gel 4 g, 4 g, Topical, TID, Pete Pelt, PA-C   Patient Care Team: Delsa Grana, PA-C as PCP - General (Family Medicine)  Objective:   Vitals: BP 120/62   Pulse 64   Temp 97.9 F (36.6 C) (Oral)   Resp 14   Ht 5\' 7"  (1.702 m)   Wt 162 lb (73.5 kg)   SpO2 99%   BMI 25.37 kg/m    Vitals:   04/16/18 1031  BP: 120/62  Pulse: 64  Resp: 14  Temp: 97.9 F (36.6 C)  TempSrc: Oral  SpO2: 99%  Weight: 162 lb (73.5 kg)  Height: 5\' 7"  (1.702 m)     Physical Exam  Constitutional: She is oriented to person, place, and time. She appears well-developed and well-nourished.  Non-toxic appearance. No distress.  HENT:  Head: Normocephalic and atraumatic.  Right Ear: External ear normal.  Left Ear: External ear normal.  Nose: Nose normal.  Mouth/Throat: Uvula is midline, oropharynx is clear and moist and mucous membranes are normal. No oropharyngeal exudate.  Eyes: Pupils are equal, round, and reactive to light. Conjunctivae, EOM and lids are normal. Right eye exhibits no discharge. Left eye exhibits no discharge. No scleral icterus.  Neck: Normal range of motion and phonation normal. Neck supple. No tracheal deviation present. No thyromegaly present.  Cardiovascular: Normal rate, regular rhythm, normal heart sounds and normal pulses. Exam reveals no gallop and no friction rub.  No murmur heard. Pulses:      Radial pulses are 2+ on the right side, and 2+ on the left side.        Posterior tibial pulses are 2+ on the right side, and 2+ on the left side.  Pulmonary/Chest: Effort normal and breath sounds normal. No stridor. No respiratory distress. She has no wheezes. She has no rhonchi. She has no rales. She exhibits no tenderness.  Abdominal: Soft. Normal appearance and bowel sounds are normal. She exhibits no distension and no mass. There is no tenderness. There is no rebound and no guarding.  Musculoskeletal: Normal range of motion. She exhibits no edema or deformity.  Lymphadenopathy:    She has no cervical adenopathy.  Neurological: She is alert and oriented to person, place, and time. She exhibits normal muscle tone. Gait normal.  Skin: Skin is warm, dry and intact. Capillary refill takes less than 2 seconds. No rash noted. She is not diaphoretic. No pallor.  Psychiatric: She has a normal mood and affect. Her speech is normal and behavior is normal. Judgment and thought content normal.  Nursing note and vitals reviewed.    Depression Screen PHQ 2/9 Scores 04/16/2018  PHQ - 2 Score 0      Assessment & Plan:     Routine Health Maintenance and Physical Exam  Exercise Activities and Dietary recommendations Goals    . DIET - REDUCE FAT INTAKE     AHA cholesterol dietary recommendations printed and reviewed        There is no immunization history on file for this patient.  Health Maintenance  Topic Date Due  . Hepatitis C Screening  Sep 03, 1960  . HIV Screening  02/18/1978  . TETANUS/TDAP  02/18/1982  . MAMMOGRAM  02/18/2013  . INFLUENZA VACCINE  03/12/2018  . PAP SMEAR  04/04/2019  . COLONOSCOPY  01/22/2026     Discussed health benefits of physical activity, and encouraged her to engage in regular exercise appropriate for her age and condition.    --------------------------------------------------------------------   Problem List Items Addressed This Visit      Other   Constipation   Overweight (BMI 25.0-29.9)   Relevant Orders   CBC  with Differential/Platelet (  Completed)   COMPLETE METABOLIC PANEL WITH GFR (Completed)   Lipid panel (Completed)   Insomnia   Relevant Medications   zolpidem (AMBIEN) 5 MG tablet   Hyperlipemia   Relevant Medications   atorvastatin (LIPITOR) 10 MG tablet   Other Relevant Orders   CBC with Differential/Platelet (Completed)   COMPLETE METABOLIC PANEL WITH GFR (Completed)   Lipid panel (Completed)   COMPLETE METABOLIC PANEL WITH GFR   Lipid panel    Other Visit Diagnoses    General medical exam    -  Primary   Needs biometric screening/physical and screening labs   Need for hepatitis C screening test       Relevant Orders   Hepatitis C antibody (Completed)   Screening for HIV without presence of risk factors       Relevant Orders   HIV antibody (Completed)   Screening for malignant neoplasm of the rectum       Need for tetanus, diphtheria, and acellular pertussis (Tdap) vaccine       Relevant Orders   Tdap vaccine greater than or equal to 7yo IM (Completed)   Need for immunization against influenza       Relevant Orders   Flu Vaccine QUAD 36+ mos IM (Completed)   Need for shingles vaccine       Screening for malignant neoplasm of breast       Relevant Orders   MM 3D SCREEN BREAST BILATERAL         Delsa Grana, PA-C 04/16/18 11:34 AM  [facility]   05/01/18 4:34 PM Fixed dx associated with mammogram order

## 2018-04-16 NOTE — Patient Instructions (Addendum)
Insomnia Insomnia is a sleep disorder that makes it difficult to fall asleep or to stay asleep. Insomnia can cause tiredness (fatigue), low energy, difficulty concentrating, mood swings, and poor performance at work or school. There are three different ways to classify insomnia:  Difficulty falling asleep.  Difficulty staying asleep.  Waking up too early in the morning.  Any type of insomnia can be long-term (chronic) or short-term (acute). Both are common. Short-term insomnia usually lasts for three months or less. Chronic insomnia occurs at least three times a week for longer than three months. What are the causes? Insomnia may be caused by another condition, situation, or substance, such as:  Anxiety.  Certain medicines.  Gastroesophageal reflux disease (GERD) or other gastrointestinal conditions.  Asthma or other breathing conditions.  Restless legs syndrome, sleep apnea, or other sleep disorders.  Chronic pain.  Menopause. This may include hot flashes.  Stroke.  Abuse of alcohol, tobacco, or illegal drugs.  Depression.  Caffeine.  Neurological disorders, such as Alzheimer disease.  An overactive thyroid (hyperthyroidism).  The cause of insomnia may not be known. What increases the risk? Risk factors for insomnia include:  Gender. Women are more commonly affected than men.  Age. Insomnia is more common as you get older.  Stress. This may involve your professional or personal life.  Income. Insomnia is more common in people with lower income.  Lack of exercise.  Irregular work schedule or night shifts.  Traveling between different time zones.  What are the signs or symptoms? If you have insomnia, trouble falling asleep or trouble staying asleep is the main symptom. This may lead to other symptoms, such as:  Feeling fatigued.  Feeling nervous about going to sleep.  Not feeling rested in the morning.  Having trouble concentrating.  Feeling  irritable, anxious, or depressed.  How is this treated? Treatment for insomnia depends on the cause. If your insomnia is caused by an underlying condition, treatment will focus on addressing the condition. Treatment may also include:  Medicines to help you sleep.  Counseling or therapy.  Lifestyle adjustments.  Follow these instructions at home:  Take medicines only as directed by your health care provider.  Keep regular sleeping and waking hours. Avoid naps.  Keep a sleep diary to help you and your health care provider figure out what could be causing your insomnia. Include: ? When you sleep. ? When you wake up during the night. ? How well you sleep. ? How rested you feel the next day. ? Any side effects of medicines you are taking. ? What you eat and drink.  Make your bedroom a comfortable place where it is easy to fall asleep: ? Put up shades or special blackout curtains to block light from outside. ? Use a white noise machine to block noise. ? Keep the temperature cool.  Exercise regularly as directed by your health care provider. Avoid exercising right before bedtime.  Use relaxation techniques to manage stress. Ask your health care provider to suggest some techniques that may work well for you. These may include: ? Breathing exercises. ? Routines to release muscle tension. ? Visualizing peaceful scenes.  Cut back on alcohol, caffeinated beverages, and cigarettes, especially close to bedtime. These can disrupt your sleep.  Do not overeat or eat spicy foods right before bedtime. This can lead to digestive discomfort that can make it hard for you to sleep.  Limit screen use before bedtime. This includes: ? Watching TV. ? Using your smartphone, tablet,  and computer.  Stick to a routine. This can help you fall asleep faster. Try to do a quiet activity, brush your teeth, and go to bed at the same time each night.  Get out of bed if you are still awake after 15 minutes  of trying to sleep. Keep the lights down, but try reading or doing a quiet activity. When you feel sleepy, go back to bed.  Make sure that you drive carefully. Avoid driving if you feel very sleepy.  Keep all follow-up appointments as directed by your health care provider. This is important. Contact a health care provider if:  You are tired throughout the day or have trouble in your daily routine due to sleepiness.  You continue to have sleep problems or your sleep problems get worse. Get help right away if:  You have serious thoughts about hurting yourself or someone else. This information is not intended to replace advice given to you by your health care provider. Make sure you discuss any questions you have with your health care provider. Document Released: 07/26/2000 Document Revised: 12/29/2015 Document Reviewed: 04/29/2014 Elsevier Interactive Patient Education  2018 [place] Maintenance, Female Adopting a healthy lifestyle and getting preventive care can go a long way to promote health and wellness. Talk with your health care provider about what schedule of regular examinations is right for you. This is a good chance for you to check in with your provider about disease prevention and staying healthy. In between checkups, there are plenty of things you can do on your own. Experts have done a lot of research about which lifestyle changes and preventive measures are most likely to keep you healthy. Ask your health care provider for more information. Weight and diet Eat a healthy diet  Be sure to include plenty of vegetables, fruits, low-fat dairy products, and lean protein.  Do not eat a lot of foods high in solid fats, added sugars, or salt.  Get regular exercise. This is one of the most important things you can do for your health. ? Most adults should exercise for at least 150 minutes each week. The exercise should increase your heart rate and make you sweat  (moderate-intensity exercise). ? Most adults should also do strengthening exercises at least twice a week. This is in addition to the moderate-intensity exercise.  Maintain a healthy weight  Body mass index (BMI) is a measurement that can be used to identify possible weight problems. It estimates body fat based on height and weight. Your health care provider can help determine your BMI and help you achieve or maintain a healthy weight.  For females 69 years of age and older: ? A BMI below 18.5 is considered underweight. ? A BMI of 18.5 to 24.9 is normal. ? A BMI of 25 to 29.9 is considered overweight. ? A BMI of 30 and above is considered obese.  Watch levels of cholesterol and blood lipids  You should start having your blood tested for lipids and cholesterol at 55 years of age, then have this test every 5 years.  You may need to have your cholesterol levels checked more often if: ? Your lipid or cholesterol levels are high. ? You are older than 55 years of age. ? You are at high risk for heart disease.  Cancer screening Lung Cancer  Lung cancer screening is recommended for adults 31-43 years old who are at high risk for lung cancer because of a history of smoking.  A yearly low-dose  CT scan of the lungs is recommended for people who: ? Currently smoke. ? Have quit within the past 15 years. ? Have at least a 30-pack-year history of smoking. A pack year is smoking an average of one pack of cigarettes a day for 1 year.  Yearly screening should continue until it has been 15 years since you quit.  Yearly screening should stop if you develop a health problem that would prevent you from having lung cancer treatment.  Breast Cancer  Practice breast self-awareness. This means understanding how your breasts normally appear and feel.  It also means doing regular breast self-exams. Let your health care provider know about any changes, no matter how small.  If you are in your 20s or  30s, you should have a clinical breast exam (CBE) by a health care provider every 1-3 years as part of a regular health exam.  If you are 42 or older, have a CBE every year. Also consider having a breast X-ray (mammogram) every year.  If you have a family history of breast cancer, talk to your health care provider about genetic screening.  If you are at high risk for breast cancer, talk to your health care provider about having an MRI and a mammogram every year.  Breast cancer gene (BRCA) assessment is recommended for women who have family members with BRCA-related cancers. BRCA-related cancers include: ? Breast. ? Ovarian. ? Tubal. ? Peritoneal cancers.  Results of the assessment will determine the need for genetic counseling and BRCA1 and BRCA2 testing.  Cervical Cancer Your health care provider may recommend that you be screened regularly for cancer of the pelvic organs (ovaries, uterus, and vagina). This screening involves a pelvic examination, including checking for microscopic changes to the surface of your cervix (Pap test). You may be encouraged to have this screening done every 3 years, beginning at age 50.  For women ages 68-65, health care providers may recommend pelvic exams and Pap testing every 3 years, or they may recommend the Pap and pelvic exam, combined with testing for human papilloma virus (HPV), every 5 years. Some types of HPV increase your risk of cervical cancer. Testing for HPV may also be done on women of any age with unclear Pap test results.  Other health care providers may not recommend any screening for nonpregnant women who are considered low risk for pelvic cancer and who do not have symptoms. Ask your health care provider if a screening pelvic exam is right for you.  If you have had past treatment for cervical cancer or a condition that could lead to cancer, you need Pap tests and screening for cancer for at least 20 years after your treatment. If Pap tests  have been discontinued, your risk factors (such as having a new sexual partner) need to be reassessed to determine if screening should resume. Some women have medical problems that increase the chance of getting cervical cancer. In these cases, your health care provider may recommend more frequent screening and Pap tests.  Colorectal Cancer  This type of cancer can be detected and often prevented.  Routine colorectal cancer screening usually begins at 55 years of age and continues through 55 years of age.  Your health care provider may recommend screening at an earlier age if you have risk factors for colon cancer.  Your health care provider may also recommend using home test kits to check for hidden blood in the stool.  A small camera at the end of a tube  can be used to examine your colon directly (sigmoidoscopy or colonoscopy). This is done to check for the earliest forms of colorectal cancer.  Routine screening usually begins at age 96.  Direct examination of the colon should be repeated every 5-10 years through 55 years of age. However, you may need to be screened more often if early forms of precancerous polyps or small growths are found.  Skin Cancer  Check your skin from head to toe regularly.  Tell your health care provider about any new moles or changes in moles, especially if there is a change in a mole's shape or color.  Also tell your health care provider if you have a mole that is larger than the size of a pencil eraser.  Always use sunscreen. Apply sunscreen liberally and repeatedly throughout the day.  Protect yourself by wearing long sleeves, pants, a wide-brimmed hat, and sunglasses whenever you are outside.  Heart disease, diabetes, and high blood pressure  High blood pressure causes heart disease and increases the risk of stroke. High blood pressure is more likely to develop in: ? People who have blood pressure in the high end of the normal range (130-139/85-89 mm  Hg). ? People who are overweight or obese. ? People who are African American.  If you are 32-21 years of age, have your blood pressure checked every 3-5 years. If you are 30 years of age or older, have your blood pressure checked every year. You should have your blood pressure measured twice-once when you are at a hospital or clinic, and once when you are not at a hospital or clinic. Record the average of the two measurements. To check your blood pressure when you are not at a hospital or clinic, you can use: ? An automated blood pressure machine at a pharmacy. ? A home blood pressure monitor.  If you are between 77 years and 35 years old, ask your health care provider if you should take aspirin to prevent strokes.  Have regular diabetes screenings. This involves taking a blood sample to check your fasting blood sugar level. ? If you are at a normal weight and have a low risk for diabetes, have this test once every three years after 55 years of age. ? If you are overweight and have a high risk for diabetes, consider being tested at a younger age or more often. Preventing infection Hepatitis B  If you have a higher risk for hepatitis B, you should be screened for this virus. You are considered at high risk for hepatitis B if: ? You were born in a country where hepatitis B is common. Ask your health care provider which countries are considered high risk. ? Your parents were born in a high-risk country, and you have not been immunized against hepatitis B (hepatitis B vaccine). ? You have HIV or AIDS. ? You use needles to inject street drugs. ? You live with someone who has hepatitis B. ? You have had sex with someone who has hepatitis B. ? You get hemodialysis treatment. ? You take certain medicines for conditions, including cancer, organ transplantation, and autoimmune conditions.  Hepatitis C  Blood testing is recommended for: ? Everyone born from [birth year] through 1963. ? Anyone with known  risk factors for hepatitis C.  Sexually transmitted infections (STIs)  You should be screened for sexually transmitted infections (STIs) including gonorrhea and chlamydia if: ? You are sexually active and are younger than 55 years of age. ? You are older than 55  years of age and your health care provider tells you that you are at risk for this type of infection. ? Your sexual activity has changed since you were last screened and you are at an increased risk for chlamydia or gonorrhea. Ask your health care provider if you are at risk.  If you do not have HIV, but are at risk, it may be recommended that you take a prescription medicine daily to prevent HIV infection. This is called pre-exposure prophylaxis (PrEP). You are considered at risk if: ? You are sexually active and do not regularly use condoms or know the HIV status of your partner(s). ? You take drugs by injection. ? You are sexually active with a partner who has HIV.  Talk with your health care provider about whether you are at high risk of being infected with HIV. If you choose to begin PrEP, you should first be tested for HIV. You should then be tested every 3 months for as long as you are taking PrEP. Pregnancy  If you are premenopausal and you may become pregnant, ask your health care provider about preconception counseling.  If you may become pregnant, take 400 to 800 micrograms (mcg) of folic acid every day.  If you want to prevent pregnancy, talk to your health care provider about birth control (contraception). Osteoporosis and menopause  Osteoporosis is a disease in which the bones lose minerals and strength with aging. This can result in serious bone fractures. Your risk for osteoporosis can be identified using a bone density scan.  If you are 83 years of age or older, or if you are at risk for osteoporosis and fractures, ask your health care provider if you should be screened.  Ask your health care provider whether you  should take a calcium or vitamin D supplement to lower your risk for osteoporosis.  Menopause may have certain physical symptoms and risks.  Hormone replacement therapy may reduce some of these symptoms and risks. Talk to your health care provider about whether hormone replacement therapy is right for you. Follow these instructions at home:  Schedule regular health, dental, and eye exams.  Stay current with your immunizations.  Do not use any tobacco products including cigarettes, chewing tobacco, or electronic cigarettes.  If you are pregnant, do not drink alcohol.  If you are breastfeeding, limit how much and how often you drink alcohol.  Limit alcohol intake to no more than 1 drink per day for nonpregnant women. One drink equals 12 ounces of beer, 5 ounces of wine, or 1 ounces of hard liquor.  Do not use street drugs.  Do not share needles.  Ask your health care provider for help if you need support or information about quitting drugs.  Tell your health care provider if you often feel depressed.  Tell your health care provider if you have ever been abused or do not feel safe at home. This information is not intended to replace advice given to you by your health care provider. Make sure you discuss any questions you have with your health care provider. Document Released: 02/11/2011 Document Revised: 01/04/2016 Document Reviewed: 05/02/2015 Elsevier Interactive Patient Education  2018 [employer].  Insomnia Insomnia is a sleep disorder that makes it difficult to fall asleep or to stay asleep. Insomnia can cause tiredness (fatigue), low energy, difficulty concentrating, mood swings, and poor performance at work or school. There are three different ways to classify insomnia:  Difficulty falling asleep.  Difficulty staying asleep.  Waking up  too early in the morning.  Any type of insomnia can be long-term (chronic) or short-term (acute). Both are common. Short-term  insomnia usually lasts for three months or less. Chronic insomnia occurs at least three times a week for longer than three months. What are the causes? Insomnia may be caused by another condition, situation, or substance, such as:  Anxiety.  Certain medicines.  Gastroesophageal reflux disease (GERD) or other gastrointestinal conditions.  Asthma or other breathing conditions.  Restless legs syndrome, sleep apnea, or other sleep disorders.  Chronic pain.  Menopause. This may include hot flashes.  Stroke.  Abuse of alcohol, tobacco, or illegal drugs.  Depression.  Caffeine.  Neurological disorders, such as Alzheimer disease.  An overactive thyroid (hyperthyroidism).  The cause of insomnia may not be known. What increases the risk? Risk factors for insomnia include:  Gender. Women are more commonly affected than men.  Age. Insomnia is more common as you get older.  Stress. This may involve your professional or personal life.  Income. Insomnia is more common in people with lower income.  Lack of exercise.  Irregular work schedule or night shifts.  Traveling between different time zones.  What are the signs or symptoms? If you have insomnia, trouble falling asleep or trouble staying asleep is the main symptom. This may lead to other symptoms, such as:  Feeling fatigued.  Feeling nervous about going to sleep.  Not feeling rested in the morning.  Having trouble concentrating.  Feeling irritable, anxious, or depressed.  How is this treated? Treatment for insomnia depends on the cause. If your insomnia is caused by an underlying condition, treatment will focus on addressing the condition. Treatment may also include:  Medicines to help you sleep.  Counseling or therapy.  Lifestyle adjustments.  Follow these instructions at home:  Take medicines only as directed by your health care provider.  Keep regular sleeping and waking hours. Avoid naps.  Keep a  sleep diary to help you and your health care provider figure out what could be causing your insomnia. Include: ? When you sleep. ? When you wake up during the night. ? How well you sleep. ? How rested you feel the next day. ? Any side effects of medicines you are taking. ? What you eat and drink.  Make your bedroom a comfortable place where it is easy to fall asleep: ? Put up shades or special blackout curtains to block light from outside. ? Use a white noise machine to block noise. ? Keep the temperature cool.  Exercise regularly as directed by your health care provider. Avoid exercising right before bedtime.  Use relaxation techniques to manage stress. Ask your health care provider to suggest some techniques that may work well for you. These may include: ? Breathing exercises. ? Routines to release muscle tension. ? Visualizing peaceful scenes.  Cut back on alcohol, caffeinated beverages, and cigarettes, especially close to bedtime. These can disrupt your sleep.  Do not overeat or eat spicy foods right before bedtime. This can lead to digestive discomfort that can make it hard for you to sleep.  Limit screen use before bedtime. This includes: ? Watching TV. ? Using your smartphone, tablet, and computer.  Stick to a routine. This can help you fall asleep faster. Try to do a quiet activity, brush your teeth, and go to bed at the same time each night.  Get out of bed if you are still awake after 15 minutes of trying to sleep. Keep the lights down,  but try reading or doing a quiet activity. When you feel sleepy, go back to bed.  Make sure that you drive carefully. Avoid driving if you feel very sleepy.  Keep all follow-up appointments as directed by your health care provider. This is important. Contact a health care provider if:  You are tired throughout the day or have trouble in your daily routine due to sleepiness.  You continue to have sleep problems or your sleep problems  get worse. Get help right away if:  You have serious thoughts about hurting yourself or someone else. This information is not intended to replace advice given to you by your health care provider. Make sure you discuss any questions you have with your health care provider. Document Released: 07/26/2000 Document Revised: 12/29/2015 Document Reviewed: 04/29/2014 Elsevier Interactive Patient Education  [employer].

## 2018-04-17 ENCOUNTER — Telehealth: Payer: Self-pay | Admitting: *Deleted

## 2018-04-17 DIAGNOSIS — K5900 Constipation, unspecified: Secondary | ICD-10-CM

## 2018-04-17 LAB — COMPLETE METABOLIC PANEL WITH GFR
AG Ratio: 1.5 (calc) (ref 1.0–2.5)
ALT: 21 U/L (ref 6–29)
AST: 25 U/L (ref 10–35)
Albumin: 4.4 g/dL (ref 3.6–5.1)
Alkaline phosphatase (APISO): 66 U/L (ref 33–130)
BUN: 24 mg/dL (ref 7–25)
CO2: 25 mmol/L (ref 20–32)
Calcium: 9.8 mg/dL (ref 8.6–10.4)
Chloride: 101 mmol/L (ref 98–110)
Creat: 0.91 mg/dL (ref 0.50–1.05)
GFR, Est African American: 82 mL/min/{1.73_m2} (ref >=60)
GFR, Est Non African American: 71 mL/min/{1.73_m2} (ref >=60)
Globulin: 2.9 g/dL (calc) (ref 1.9–3.7)
Glucose, Bld: 87 mg/dL (ref 65–99)
Potassium: 4.1 mmol/L (ref 3.5–5.3)
Sodium: 138 mmol/L (ref 135–146)
Total Bilirubin: 0.6 mg/dL (ref 0.2–1.2)
Total Protein: 7.3 g/dL (ref 6.1–8.1)

## 2018-04-17 LAB — CBC WITH DIFFERENTIAL/PLATELET
Basophils Absolute: 31 cells/uL (ref 0–200)
Basophils Relative: 0.6 %
Eosinophils Absolute: 41 cells/uL (ref 15–500)
Eosinophils Relative: 0.8 %
HCT: 44 % (ref 35.0–45.0)
Hemoglobin: 14.9 g/dL (ref 11.7–15.5)
Lymphs Abs: 1459 cells/uL (ref 850–3900)
MCH: 29.4 pg (ref 27.0–33.0)
MCHC: 33.9 g/dL (ref 32.0–36.0)
MCV: 87 fL (ref 80.0–100.0)
MPV: 9.8 fL (ref 7.5–12.5)
Monocytes Relative: 8.8 %
Neutro Abs: 3121 cells/uL (ref 1500–7800)
Neutrophils Relative %: 61.2 %
Platelets: 234 10*3/uL (ref 140–400)
RBC: 5.06 10*6/uL (ref 3.80–5.10)
RDW: 12.2 % (ref 11.0–15.0)
Total Lymphocyte: 28.6 %
WBC mixed population: 449 cells/uL (ref 200–950)
WBC: 5.1 10*3/uL (ref 3.8–10.8)

## 2018-04-17 LAB — LIPID PANEL
Cholesterol: 271 mg/dL — ABNORMAL HIGH (ref <200)
HDL: 91 mg/dL (ref >50)
LDL Cholesterol (Calc): 164 mg/dL (calc) — ABNORMAL HIGH
Non-HDL Cholesterol (Calc): 180 mg/dL (calc) — ABNORMAL HIGH (ref <130)
Total CHOL/HDL Ratio: 3 (calc) (ref <5.0)
Triglycerides: 64 mg/dL (ref <150)

## 2018-04-17 LAB — HEPATITIS C ANTIBODY
HEP C AB: NONREACTIVE
SIGNAL TO CUT-OFF: 0.04 (ref <1.00)

## 2018-04-17 LAB — HIV ANTIBODY (ROUTINE TESTING W REFLEX): HIV 1&2 Ab, 4th Generation: NONREACTIVE

## 2018-04-17 NOTE — Telephone Encounter (Signed)
Received request from pharmacy for [place] on [address].  PA submitted.   Dx: [id number]

## 2018-04-17 NOTE — Telephone Encounter (Signed)
[facility] is reviewing your PA request. Typically an electronic response will be received within 72 hours. To check for an update later, open this request from your dashboard.    You may close this dialog and return to your dashboard to perform other tasks.

## 2018-04-20 MED ORDER — LINACLOTIDE 145 MCG PO CAPS: 145.0000 ug | ORAL_CAPSULE | Freq: Every day | ORAL | 2 refills | Status: DC

## 2018-04-20 NOTE — Telephone Encounter (Signed)
Received PA determination.   [id number] approved through 04/18/2019.  Pharmacy made aware.

## 2018-04-21 ENCOUNTER — Encounter: Payer: Self-pay | Admitting: Family Medicine

## 2018-04-21 DIAGNOSIS — E785 Hyperlipidemia, unspecified: Secondary | ICD-10-CM | POA: Insufficient documentation

## 2018-04-21 MED ORDER — ATORVASTATIN CALCIUM 10 MG PO TABS: 10.0000 mg | ORAL_TABLET | Freq: Every day | ORAL | 3 refills | Status: DC

## 2018-04-21 NOTE — Telephone Encounter (Signed)
Excellent.  Thank you 

## 2018-04-22 ENCOUNTER — Telehealth: Payer: Self-pay | Admitting: *Deleted

## 2018-04-22 NOTE — Telephone Encounter (Signed)
Call placed to patient and patient made aware.   Will double dose for a while and re-check. Will also work on sleep hygiene.

## 2018-04-22 NOTE — Telephone Encounter (Signed)
Received call from patient.   Reports that she was given Ambien 5mg  for insomnia. States that she has been taking 5mg  with no noted relief. States that she took (2) tabs last night (10mg ) and noted that she was able to go to sleep easier, but she still awoke around 1am and was up until 4am.   Please advise.

## 2018-04-22 NOTE — Telephone Encounter (Signed)
She should still work on sleep hygiene as discussed.   10 mg is safe for trial.  If she has not been sleeping well for several years I do not expect her to have a drastic change.  The [place]rin Mais only helps her get to sleep, it does not help her stay asleep.

## 2018-05-04 ENCOUNTER — Inpatient Hospital Stay (HOSPITAL_COMMUNITY): Admission: RE | Admit: 2018-05-04 | Payer: 59 | Source: Ambulatory Visit

## 2018-05-14 ENCOUNTER — Ambulatory Visit (HOSPITAL_COMMUNITY): Payer: 59

## 2018-05-18 ENCOUNTER — Ambulatory Visit (HOSPITAL_COMMUNITY): Payer: 59

## 2018-05-21 ENCOUNTER — Ambulatory Visit (HOSPITAL_COMMUNITY)
Admission: RE | Admit: 2018-05-21 | Discharge: 2018-05-21 | Disposition: A | Discharge status: Home / Self Care | Payer: 59 | Source: Ambulatory Visit | Attending: Family Medicine | Admitting: Family Medicine

## 2018-05-21 ENCOUNTER — Encounter (HOSPITAL_COMMUNITY): Payer: Self-pay

## 2018-05-21 DIAGNOSIS — Z1239 Encounter for other screening for malignant neoplasm of breast: Secondary | ICD-10-CM | POA: Insufficient documentation

## 2018-06-18 ENCOUNTER — Other Ambulatory Visit: Payer: Self-pay | Admitting: *Deleted

## 2018-06-18 DIAGNOSIS — G4700 Insomnia, unspecified: Secondary | ICD-10-CM

## 2018-06-18 NOTE — Telephone Encounter (Signed)
Received fax requesting refill on Ambien.   Ok to refill??  Last office visit/ refill 04/16/2018.

## 2018-06-19 MED ORDER — ZOLPIDEM TARTRATE 5 MG PO TABS: 5.0000 mg | ORAL_TABLET | Freq: Every evening | ORAL | 0 refills | Status: DC | PRN

## 2018-06-27 ENCOUNTER — Encounter: Payer: Self-pay | Admitting: *Deleted

## 2018-06-27 DIAGNOSIS — R7309 Other abnormal glucose: Secondary | ICD-10-CM | POA: Insufficient documentation

## 2018-06-27 DIAGNOSIS — F32A Depression, unspecified: Secondary | ICD-10-CM | POA: Insufficient documentation

## 2018-06-27 DIAGNOSIS — F329 Major depressive disorder, single episode, unspecified: Secondary | ICD-10-CM | POA: Insufficient documentation

## 2018-06-27 DIAGNOSIS — F419 Anxiety disorder, unspecified: Secondary | ICD-10-CM

## 2018-06-27 DIAGNOSIS — K5790 Diverticulosis of intestine, part unspecified, without perforation or abscess without bleeding: Secondary | ICD-10-CM | POA: Insufficient documentation

## 2018-07-23 ENCOUNTER — Ambulatory Visit (INDEPENDENT_AMBULATORY_CARE_PROVIDER_SITE_OTHER): Payer: 59 | Admitting: Family Medicine

## 2018-07-23 ENCOUNTER — Encounter: Payer: Self-pay | Admitting: Family Medicine

## 2018-07-23 VITALS — BP 126/92 | HR 70 | Temp 97.7°F | Resp 15 | Ht 67.0 in | Wt 166.5 lb

## 2018-07-23 DIAGNOSIS — M778 Other enthesopathies, not elsewhere classified: Secondary | ICD-10-CM

## 2018-07-23 DIAGNOSIS — M779 Enthesopathy, unspecified: Secondary | ICD-10-CM

## 2018-07-23 MED ORDER — PREDNISONE 20 MG PO TABS: ORAL_TABLET | ORAL | 0 refills | Status: DC

## 2018-07-23 NOTE — Progress Notes (Signed)
Patient ID: Barbara Bradford, female    DOB: 10-09-1960, 57 y.o.   MRN: [id number]  PCP: Delsa Grana, PA-C  Chief Complaint  Patient presents with  . Hand Pain    Patient has c/o of thumb pain. 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    Subjective:   Barbara Bradford is a 57 y.o. female, presents to clinic with CC of left thumb pain x several months, recently worsening.  She is LHD, she is experiencing pain in both thumbs and wrists, to the base of thumb, associated decreased grip strength, pain with gripping and other repetitive motions.  Pain is severe, sharp, achy, constant, radiates up arm, is starting to wake her from sleep.  She is also dropping things with left hand and cannot open knobs/doors or clean her ears with q-tips or maneuver her curling iron lately.  No injury, redness, swelling, deformity, numbness or tingling.   No treatments tried, no past assessments.  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  She previously had elbow tendinitis about 15 years ago that she saw a hand specialist for and after a local injection pain completely resolved.  She has not had any other joint pain inflammation or problems   Patient Active Problem List   Diagnosis Date Noted  . Diverticulosis 06/27/2018  . Anxiety and depression 06/27/2018  . Other abnormal glucose 06/27/2018  . Hyperlipemia 04/21/2018  . Overweight (BMI 25.0-29.9) 04/16/2018  . Insomnia 04/16/2018  . Constipation 07/10/2010     Prior to Admission medications   Medication Sig Start Date End Date Taking? Authorizing Provider  atorvastatin (LIPITOR) 10 MG tablet Take 1 tablet (10 mg total) by mouth daily. 04/21/18  Yes Delsa Grana, PA-C  Wheat Dextrin (BENEFIBER) POWD Use tablespoon three times per day one bottle 350 GMS 01/23/16  Yes  Nandigam, Venia Minks, MD  zolpidem (AMBIEN) 5 MG tablet Take 1 tablet (5 mg total) by mouth at bedtime as needed for sleep. 06/19/18  Yes Delsa Grana, PA-C  linaclotide (LINZESS) 145 MCG CAPS capsule Take 1 capsule (145 mcg total) by mouth daily before breakfast. Patient not taking: Reported on 07/23/2018 04/20/18   Delsa Grana, PA-C     No Known Allergies   Family History  Problem Relation Age of Onset  . Dementia Mother   . Diabetes Mother   . Stroke Father   . Pancreatic cancer Sister      Social History   Socioeconomic History  . Marital status: Married    Spouse name: Not on file  . Number of children: 2  . Years of education: Not on file  . Highest education level: Not on file  Occupational History  . Not on file  Social Needs  . Financial resource strain: Not on file  . Food insecurity:    Worry: Not on file    Inability: Not on file  . Transportation needs:    Medical: Not on file    Non-medical: Not on file  Tobacco Use  . Smoking status: Former Smoker    Types: Cigarettes    Last attempt to quit: 04/16/1997    Years since quitting: 21.2  . Smokeless tobacco: Never Used  Substance and Sexual Activity  . Alcohol use: Yes  Alcohol/week: 2.0 standard drinks    Types: 2 Cans of beer per week  . Drug use: No  . Sexual activity: Yes    Partners: Male    Birth control/protection: None, Post-menopausal  Lifestyle  . Physical activity:    Days per week: 4 days    Minutes per session: 90 min  . Stress: Not on file  Relationships  . Social connections:    Talks on phone: Not on file    Gets together: Not on file    Attends religious service: Not on file    Active member of club or organization: Not on file    Attends meetings of clubs or organizations: Not on file    Relationship status: Not on file  . Intimate partner violence:    Fear of current or ex partner: Not on file    Emotionally abused: Not on file    Physically abused: Not on file    Forced  sexual activity: Not on file  Other Topics Concern  . Not on file  Social History Narrative  . Not on file     Review of Systems  Constitutional: Negative.   HENT: Negative.   Eyes: Negative.   Respiratory: Negative.   Cardiovascular: Negative.   Gastrointestinal: Negative.   Endocrine: Negative.   Genitourinary: Negative.   Musculoskeletal: Negative.   Skin: Negative.   Allergic/Immunologic: Negative.   Neurological: Negative.   Hematological: Negative.   Psychiatric/Behavioral: Negative.   All other systems reviewed and are negative.      Objective:    Vitals:   07/23/18 1531  BP: (!) 126/92  Pulse: 70  Resp: 15  Temp: 97.7 F (36.5 C)  TempSrc: Oral  SpO2: 98%  Weight: 166 lb 8 oz (75.5 kg)  Height: 5\' 7"  (1.702 m)      Physical Exam Vitals signs and nursing note reviewed.  Constitutional:      Appearance: She is well-developed.  HENT:     Head: Normocephalic and atraumatic.     Nose: Nose normal.  Eyes:     General:        Right eye: No discharge.        Left eye: No discharge.     Conjunctiva/sclera: Conjunctivae normal.  Neck:     Trachea: No tracheal deviation.  Cardiovascular:     Rate and Rhythm: Normal rate and regular rhythm.  Pulmonary:     Effort: Pulmonary effort is normal. No respiratory distress.     Breath sounds: No stridor.  Musculoskeletal: Normal range of motion.     Right wrist: Normal.     Left wrist: Normal.     Right hand: She exhibits tenderness (very mild). She exhibits normal range of motion, no bony tenderness, normal two-point discrimination, normal capillary refill, no deformity, no laceration and no swelling. Normal sensation noted. Decreased sensation is not present in the ulnar distribution, is not present in the medial distribution and is not present in the radial distribution. Normal strength noted. She exhibits no finger abduction, no thumb/finger opposition and no wrist extension trouble.     Left hand: She  exhibits tenderness. She exhibits normal range of motion, no bony tenderness, normal two-point discrimination, normal capillary refill, no deformity, no laceration and no swelling. Normal sensation noted. Decreased sensation is not present in the ulnar distribution, is not present in the medial redistribution and is not present in the radial distribution. Normal strength noted. She exhibits no finger abduction, no thumb/finger opposition and  no wrist extension trouble.       Hands:     Comments: No snuff box tenderness b/l Negative Tinel's and phalen's b/l Negative finkelstein b/l No crepitus palpated  No edema or erythema Symmetrical 5/5 grip strength  Skin:    General: Skin is warm and dry.     Findings: No rash.  Neurological:     Mental Status: She is alert.     Motor: No abnormal muscle tone.     Coordination: Coordination normal.  Psychiatric:        Behavior: Behavior normal.           Assessment & Plan:      ICD-10-CM   1. Thumb tendonitis M77.9    bilateral, but Left > right, worsening for the past month, has been intermittent since onset several months ago.  Exam pertinent for tenderness to palpation bilaterally to 1st MC palmar and dorsal b/l and otherwise unremarkable. RICE tx, NSAIDs, steroid burst back up if conservative measures do not help severe pain.  Sx are bilateral, but much worse on left - LHD.  Rx for thumb spica given to pt.  Encouraged to try and rest in splint, avoid repetitive activities such as typing gripping and twisting, use over-the-counter NSAIDs and icing and if pain continues to be severe stop the NSAIDs and try the steroids for 6 days.  Encouraged her to follow-up closely if no improvement would refer her to a hand specialist.    Patient also asks for more samples of Linzess, prescription was given for her she has noted success with her chronic constipation with taking Linzess, she was unable to afford the price of the medicine it was several 100's  of dollars, she was unable to find any discount or coupon cards.  Samples given  Delsa Grana, PA-C 07/23/18 3:46 PM

## 2018-07-23 NOTE — Patient Instructions (Signed)
See the information below about thumb tendinitis  I would start with over-the-counter anti-inflammatory medicine such as naproxen, Aleve or ibuprofen (NSAIDs)  in high doses, use the prescription and get a splint/brace, ice and elevate.    If this does not begin to improve your pain you can fill the prescription of steroids, when finished you can resume the NSAIDs  If it does not gradually get better we can refer you to a hand specialist for further evaluation and treatment     Barbara Bradford Disease Barbara Bradford disease is inflammation of the tendon on the thumb side of the wrist. Tendons are cords of tissue that connect bones to muscles. The tendons in your hand pass through a tunnel, or sheath. A slippery layer of tissue (synovium) lets the tendons move smoothly in the sheath. With de Quervain disease, the sheath swells or thickens, causing friction and pain. The condition is also called de Quervain tendinosis and de Quervain syndrome. It occurs most often in women who are 52-45 years old. What are the causes? The exact cause of de Quervain disease is not known. It may result from:  Overusing your hands, especially with repetitive motions that involve twisting your hand or using a forceful grip.  Pregnancy.  Rheumatoid disease.  What increases the risk? You may have a greater risk for de Quervain disease if you:  Are a middle-aged woman.  Are pregnant.  Have rheumatoid arthritis.  Have diabetes.  Use your hands far more than normal, especially with a tight grip or excessive twisting.  What are the signs or symptoms? Pain on the thumb side of your wrist is the main symptom of de Quervain disease. Other signs and symptoms include:  Pain that gets worse when you grasp something or turn your wrist.  Pain that extends up the forearm.  Cysts in the area of the pain.  Swelling of your wrist and hand.  A sensation of snapping in the wrist.  Trouble moving the thumb and  wrist.  How is this diagnosed? Your health care provider may diagnose de Quervain disease based on your signs and symptoms. A physical exam will also be done. A simple test Wynn Maudlin test) that involves pulling your thumb and wrist to see if this causes pain can help determine whether you have the condition. Sometimes you may need to have an X-ray. How is this treated? Avoiding any activity that causes pain and swelling is the best treatment. Other options include:  Wearing a splint.  Taking medicine. Anti-inflammatory medicines and corticosteroid injections may reduce inflammation and relieve pain.  Having surgery if other treatments do not work.  Follow these instructions at home:  Using ice can be helpful after doing activities that involve the sore wrist. To apply ice to the injured area: ? Put ice in a plastic bag. ? Place a towel between your skin and the bag. ? Leave the ice on for 20 minutes, 2-3 times a day.  Take medicines only as directed by your health care provider.  Wear your splint as directed. This will allow your hand to rest and heal. Contact a health care provider if:  Your pain medicine does not help.  Your pain gets worse.  You develop new symptoms. This information is not intended to replace advice given to you by your health care provider. Make sure you discuss any questions you have with your health care provider. Document Released: 04/23/2001 Document Revised: 01/04/2016 Document Reviewed: 12/01/2013 Elsevier Interactive Patient Education  2018  [employer].

## 2018-08-13 ENCOUNTER — Telehealth: Payer: Self-pay | Admitting: Family Medicine

## 2018-08-13 DIAGNOSIS — G4700 Insomnia, unspecified: Secondary | ICD-10-CM

## 2018-08-13 MED ORDER — ZOLPIDEM TARTRATE 5 MG PO TABS: 5.0000 mg | ORAL_TABLET | Freq: Every evening | ORAL | 0 refills | Status: DC | PRN

## 2018-08-13 NOTE — Telephone Encounter (Signed)
Patient is requesting her Lorrin Mais to be called into [facility] on [address]. She states they have requested twice now I didn't see a request.  Her CB# [phone]

## 2018-08-13 NOTE — Telephone Encounter (Signed)
Left message return call

## 2018-08-27 ENCOUNTER — Ambulatory Visit (INDEPENDENT_AMBULATORY_CARE_PROVIDER_SITE_OTHER): Payer: 59 | Admitting: Family Medicine

## 2018-08-27 ENCOUNTER — Encounter: Payer: Self-pay | Admitting: Family Medicine

## 2018-08-27 VITALS — BP 120/76 | HR 64 | Temp 98.1°F | Wt 165.0 lb

## 2018-08-27 DIAGNOSIS — K5900 Constipation, unspecified: Secondary | ICD-10-CM | POA: Diagnosis not present

## 2018-08-27 DIAGNOSIS — J0140 Acute pansinusitis, unspecified: Secondary | ICD-10-CM

## 2018-08-27 DIAGNOSIS — G4700 Insomnia, unspecified: Secondary | ICD-10-CM

## 2018-08-27 MED ORDER — LINACLOTIDE 145 MCG PO CAPS: 145.0000 ug | ORAL_CAPSULE | Freq: Every day | ORAL | 2 refills | Status: DC

## 2018-08-27 MED ORDER — AMOXICILLIN-POT CLAVULANATE 875-125 MG PO TABS: 1.0000 | ORAL_TABLET | Freq: Two times a day (BID) | ORAL | 0 refills | Status: AC

## 2018-08-27 NOTE — Progress Notes (Signed)
Patient ID: Barbara Bradford, female    DOB: 1961/05/29, 58 y.o.   MRN: [id number]  PCP: Delsa Grana, PA-C  Chief Complaint  Patient presents with  . Sinusitis    Patient in with c/o post nasal drip,  sinus pressure behind eyes.     Subjective:   Barbara Bradford is a 58 y.o. female, presents to clinic with CC of uri URI   This is a new problem. Episode onset: 2 weeks. There has been no fever (subjective fever last 2 days). Associated symptoms include coughing (dry), headaches (temples forehead worse in the am), rhinorrhea, sinus pain and a sore throat. Pertinent negatives include no abdominal pain, chest pain, diarrhea, ear pain, nausea, rash, sneezing, vomiting or wheezing.   Sweats and chills last two days, acutely worsening HA and eye pain/pressure 2 days Shes been using flonase and last week it helped a little with throat Shes tried nyquil, decongestants - no improvement    Husband sick with similar sx, got her sick. No hx of lung disease No wheeze, SOB, CP, N, V, D    Patient Active Problem List   Diagnosis Date Noted  . Diverticulosis 06/27/2018  . Anxiety and depression 06/27/2018  . Other abnormal glucose 06/27/2018  . Hyperlipemia 04/21/2018  . Overweight (BMI 25.0-29.9) 04/16/2018  . Insomnia 04/16/2018  . Constipation 07/10/2010     Prior to Admission medications   Medication Sig Start Date End Date Taking? Authorizing Provider  atorvastatin (LIPITOR) 10 MG tablet Take 1 tablet (10 mg total) by mouth daily. 04/21/18  Yes Delsa Grana, PA-C  linaclotide (LINZESS) 145 MCG CAPS capsule Take 1 capsule (145 mcg total) by mouth daily before breakfast. 04/20/18  Yes Delsa Grana, PA-C  Wheat Dextrin (BENEFIBER) POWD Use tablespoon three times per day one bottle 350 GMS 01/23/16  Yes Nandigam, Venia Minks, MD  zolpidem (AMBIEN) 5 MG tablet Take 1 tablet (5 mg total) by mouth at bedtime as needed for sleep. 08/13/18  Yes Delsa Grana, PA-C     No Known  Allergies   Family History  Problem Relation Age of Onset  . Dementia Mother   . Diabetes Mother   . Stroke Father   . Pancreatic cancer Sister      Social History   Socioeconomic History  . Marital status: Married    Spouse name: Not on file  . Number of children: 2  . Years of education: Not on file  . Highest education level: Not on file  Occupational History  . Not on file  Social Needs  . Financial resource strain: Not on file  . Food insecurity:    Worry: Not on file    Inability: Not on file  . Transportation needs:    Medical: Not on file    Non-medical: Not on file  Tobacco Use  . Smoking status: Former Smoker    Types: Cigarettes    Last attempt to quit: 04/16/1997    Years since quitting: 21.3  . Smokeless tobacco: Never Used  Substance and Sexual Activity  . Alcohol use: Yes    Alcohol/week: 2.0 standard drinks    Types: 2 Cans of beer per week  . Drug use: No  . Sexual activity: Yes    Partners: Male    Birth control/protection: None, Post-menopausal  Lifestyle  . Physical activity:    Days per week: 4 days    Minutes per session: 90 min  . Stress: Not on file  Relationships  .  Social connections:    Talks on phone: Not on file    Gets together: Not on file    Attends religious service: Not on file    Active member of club or organization: Not on file    Attends meetings of clubs or organizations: Not on file    Relationship status: Not on file  . Intimate partner violence:    Fear of current or ex partner: Not on file    Emotionally abused: Not on file    Physically abused: Not on file    Forced sexual activity: Not on file  Other Topics Concern  . Not on file  Social History Narrative  . Not on file     Review of Systems  Constitutional: Positive for chills, diaphoresis and fatigue. Negative for appetite change and fever (has been taking ibuprofen "around the clock" so shes not sure).  HENT: Positive for postnasal drip, rhinorrhea,  sinus pressure, sinus pain, sore throat and voice change. Negative for ear discharge, ear pain, sneezing and trouble swallowing.   Eyes: Negative.   Respiratory: Positive for cough (dry). Negative for choking, chest tightness, shortness of breath and wheezing.   Cardiovascular: Negative.  Negative for chest pain, palpitations and leg swelling.  Gastrointestinal: Negative.  Negative for abdominal pain, diarrhea, nausea and vomiting.  Endocrine: Negative.   Genitourinary: Negative.   Musculoskeletal: Negative.   Skin: Negative.  Negative for pallor and rash.  Allergic/Immunologic: Negative.   Neurological: Positive for headaches (temples forehead worse in the am). Negative for dizziness and weakness.  Hematological: Negative.  Negative for adenopathy.  Psychiatric/Behavioral: Positive for sleep disturbance.  All other systems reviewed and are negative.      Objective:    Vitals:   08/27/18 1125  BP: 120/76  Pulse: 64  Temp: 98.1 F (36.7 C)  TempSrc: Oral  SpO2: 98%  Weight: 165 lb (74.8 kg)      Physical Exam Vitals signs and nursing note reviewed.  Constitutional:      General: She is not in acute distress.    Appearance: She is well-developed. She is ill-appearing (mildly). She is not toxic-appearing or diaphoretic.  HENT:     Head: Normocephalic and atraumatic.     Right Ear: Hearing, tympanic membrane, ear canal and external ear normal. There is no impacted cerumen.     Left Ear: Hearing, tympanic membrane, ear canal and external ear normal. There is no impacted cerumen.     Nose: Nasal tenderness, mucosal edema (and raw severe erythema), congestion and rhinorrhea present.     Right Sinus: Frontal sinus tenderness present. No maxillary sinus tenderness.     Left Sinus: Frontal sinus tenderness present. No maxillary sinus tenderness.     Mouth/Throat:     Mouth: Mucous membranes are moist. Mucous membranes are not pale.     Pharynx: Uvula midline. Posterior  oropharyngeal erythema present. No oropharyngeal exudate or uvula swelling.     Tonsils: No tonsillar abscesses.  Eyes:     General:        Right eye: No discharge.        Left eye: No discharge.     Conjunctiva/sclera: Conjunctivae normal.     Pupils: Pupils are equal, round, and reactive to light.  Neck:     Musculoskeletal: Normal range of motion and neck supple.     Trachea: No tracheal deviation.  Cardiovascular:     Rate and Rhythm: Normal rate and regular rhythm.     Heart sounds:  Normal heart sounds.  Pulmonary:     Effort: Pulmonary effort is normal. No respiratory distress.     Breath sounds: Normal breath sounds. No stridor. No wheezing, rhonchi or rales.  Abdominal:     General: Bowel sounds are normal. There is no distension.     Palpations: Abdomen is soft.  Musculoskeletal: Normal range of motion.  Skin:    General: Skin is warm and dry.     Capillary Refill: Capillary refill takes less than 2 seconds.     Coloration: Skin is not pale.     Findings: No rash.  Neurological:     Mental Status: She is alert.     Motor: No abnormal muscle tone.     Coordination: Coordination normal.  Psychiatric:        Behavior: Behavior normal.           Assessment & Plan:   58 y/o female presents with worsening uri sx x2 weeks    ICD-10-CM   1. Acute non-recurrent pansinusitis J01.40    2 weeks uri, acutely worsening x 2 day + sweats, HA, sinus/eye pain, tx for ABS with augmentin   2. Constipation, unspecified constipation type K59.00 linaclotide (LINZESS) 145 MCG CAPS capsule   improved with linzess 145 mcg, con't, follow up as needed - needed refill  3. Insomnia, unspecified type G47.00    discussed transitioning to XR form on meds for sleep - with next refill     There was a problem in the pharmacy with the [place] prescription so she is asked for it to be refilled  Discussed her increasing dose of Ambien she is now regularly taking 10 mg at night and still waking  up at 3 AM, I discussed with her that I would prefer to transition her off of Ambien immediate release and see if we can get her on a slow release medicine and would like to work on this in the next couple months - will discuss when she's due for next refill   Delsa Grana, PA-C 08/27/18 11:49 AM

## 2018-10-16 ENCOUNTER — Other Ambulatory Visit: Payer: Self-pay | Admitting: Family Medicine

## 2018-10-16 DIAGNOSIS — G4700 Insomnia, unspecified: Secondary | ICD-10-CM

## 2018-10-16 NOTE — Telephone Encounter (Signed)
Ok to refill??  Last office visit 08/27/2018  Last refill 08/13/2018.

## 2018-12-24 ENCOUNTER — Other Ambulatory Visit: Payer: Self-pay | Admitting: Family Medicine

## 2018-12-24 DIAGNOSIS — G4700 Insomnia, unspecified: Secondary | ICD-10-CM

## 2018-12-24 NOTE — Telephone Encounter (Signed)
Requested Prescriptions   Pending Prescriptions Disp Refills  . zolpidem (AMBIEN) 5 MG tablet [Pharmacy Med Name: ZOLPIDEM TARTRATE 5 MG TABLET] 30 tablet 1    Sig: TAKE 1 TABLET BY MOUTH EVERY DAY AT BEDTIME AS NEEDED FOR SLEEP   Last OV 08/27/2018 Last written 10/16/2018

## 2019-01-28 ENCOUNTER — Encounter: Payer: Self-pay | Admitting: Physician Assistant

## 2019-01-28 ENCOUNTER — Ambulatory Visit (INDEPENDENT_AMBULATORY_CARE_PROVIDER_SITE_OTHER): Payer: 59 | Admitting: Physician Assistant

## 2019-01-28 ENCOUNTER — Ambulatory Visit: Payer: Self-pay

## 2019-01-28 ENCOUNTER — Other Ambulatory Visit: Payer: Self-pay

## 2019-01-28 DIAGNOSIS — M7061 Trochanteric bursitis, right hip: Secondary | ICD-10-CM | POA: Diagnosis not present

## 2019-01-28 MED ORDER — DICLOFENAC SODIUM 75 MG PO TBEC: 75.0000 mg | DELAYED_RELEASE_TABLET | Freq: Two times a day (BID) | ORAL | 1 refills | Status: DC

## 2019-01-28 MED ORDER — LIDOCAINE HCL 1 % IJ SOLN
3.0000 mL | INTRAMUSCULAR | Status: AC | PRN
Start: 2019-01-28 — End: 2019-01-28
  Administered 2019-01-28: 10:00:00 3 mL

## 2019-01-28 MED ORDER — METHYLPREDNISOLONE ACETATE 40 MG/ML IJ SUSP
40.0000 mg | INTRAMUSCULAR | Status: AC | PRN
  Administered 2019-01-28: 10:00:00 40 mg via INTRA_ARTICULAR

## 2019-01-28 NOTE — Progress Notes (Signed)
   Procedure Note  Patient: Barbara Bradford             Date of Birth: 1960/12/19           MRN: [id number]             Visit Date: 01/28/2019  Procedures: Visit Diagnoses: Trochanteric bursitis right hip  Large Joint Inj: R greater trochanter on 01/28/2019 10:07 AM Indications: pain Details: 22 G 1.5 in needle, lateral approach  Arthrogram: No  Medications: 3 mL lidocaine 1 %; 40 mg methylPREDNISolone acetate 40 MG/ML Outcome: tolerated well, no immediate complications Procedure, treatment alternatives, risks and benefits explained, specific risks discussed. Consent was given by the patient. Immediately prior to procedure a time out was called to verify the correct patient, procedure, equipment, support staff and site/side marked as required. Patient was prepped and draped in the usual sterile fashion.

## 2019-01-28 NOTE — Progress Notes (Signed)
Office Visit Note   Patient: Barbara Bradford           Date of Birth: 12/10/1960           MRN: [id number] Visit Date: 01/28/2019              Requested by: Barbara Grana, PA-C [address],  [place] [address] PCP: Barbara Grana, PA-C   Assessment & Plan: Visit Diagnoses:  1. Trochanteric bursitis, right hip     Plan: She is shown IT band stretching exercises she can perform on her own.  We will also place her on diclofenac 75 mg 1 p.o. twice daily due to the fact that she is been taking significant amount of ibuprofen would like to take last time instead of the day.  Cautioned her to take no other NSAIDs while on the diclofenac.  See her back in 2 weeks check her response to the injection and the stretching.  She may benefit from an intra-articular injection of the right hip versus MRI to evaluate cartilage if her pain continues.  She will slowly advance activities as tolerated.  Follow-Up Instructions: Return in about 2 weeks (around 02/11/2019).   Orders:  Orders Placed This Encounter  Procedures  . Large Joint Inj: R greater trochanter  . XR HIP UNILAT W OR W/O PELVIS 1V RIGHT   Meds ordered this encounter  Medications  . diclofenac (VOLTAREN) 75 MG EC tablet    Sig: Take 1 tablet (75 mg total) by mouth 2 (two) times daily.    Dispense:  60 tablet    Refill:  1      Procedures: No procedures performed   Clinical Data: No additional findings.   Subjective: Chief Complaint  Patient presents with  . Right Leg - Pain    HPI Barbara Bradford comes in today due to right leg pain is been ongoing for the past week.  She states is been running and had pain in the right groin.  She notes she has had lateral pain down the right hip for some time particularly whenever lying on the hip.  But now she is having overall right thigh pain groin pain and lateral hip pain.  She had no injury. Review of Systems  Denies any fevers or chills.  Please see HPI otherwise negative  or noncontributory.  Objective: Vital Signs: There were no vitals taken for this visit.  Physical Exam General: Well-developed well-nourished female no acute distress mood affect appropriate. Psych alert and oriented x3. Ortho Exam  Bilateral hips: Fluid range of motion with range of motion bilateral hips.  Greater discomfort with external rotation of the right hip.  Tenderness over the trochanteric region of both hips.  Good range of motion bilateral knees without pain.  There is no instability valgus varus stressing of either knee.  No effusion abnormal warmth to either knee.  Both knees are nontender throughout. Specialty Comments:  No specialty comments available.  Imaging: No results found.   PMFS History: Patient Active Problem List   Diagnosis Date Noted  . Diverticulosis 06/27/2018  . Anxiety and depression 06/27/2018  . Other abnormal glucose 06/27/2018  . Hyperlipemia 04/21/2018  . Overweight (BMI 25.0-29.9) 04/16/2018  . Insomnia 04/16/2018  . Constipation 07/10/2010   Past Medical History:  Diagnosis Date  . Abdominal bloating   . Diverticulosis    history of  . Gas    at times  . Hx of abnormal cervical Pap smear  Family History  Problem Relation Age of Onset  . Dementia Mother   . Diabetes Mother   . Stroke Father   . Pancreatic cancer Sister     Past Surgical History:  Procedure Laterality Date  . ABDOMINAL HYSTERECTOMY     has part of one ovary  . abdominaplasty  2000  . AUGMENTATION MAMMAPLASTY Bilateral    implants removed a year ago  . BREAST ENHANCEMENT SURGERY    . CLAVICLE SURGERY     left side/had pins removed after a fracture  . hx coloposcopy     Social History   Occupational History  . Not on file  Tobacco Use  . Smoking status: Former Smoker    Types: Cigarettes    Quit date: 04/16/1997    Years since quitting: 21.8  . Smokeless tobacco: Never Used  Substance and Sexual Activity  . Alcohol use: Yes    Alcohol/week: 2.0  standard drinks    Types: 2 Cans of beer per week  . Drug use: No  . Sexual activity: Yes    Partners: Male    Birth control/protection: None, Post-menopausal

## 2019-02-01 ENCOUNTER — Other Ambulatory Visit: Payer: Self-pay

## 2019-02-01 DIAGNOSIS — K5900 Constipation, unspecified: Secondary | ICD-10-CM

## 2019-02-01 MED ORDER — LINACLOTIDE 145 MCG PO CAPS: 145.0000 ug | ORAL_CAPSULE | Freq: Every day | ORAL | 2 refills | Status: DC

## 2019-02-08 ENCOUNTER — Other Ambulatory Visit: Payer: Self-pay

## 2019-02-08 ENCOUNTER — Encounter: Payer: Self-pay | Admitting: Family Medicine

## 2019-02-08 ENCOUNTER — Ambulatory Visit (INDEPENDENT_AMBULATORY_CARE_PROVIDER_SITE_OTHER): Payer: 59 | Admitting: Family Medicine

## 2019-02-08 DIAGNOSIS — M7061 Trochanteric bursitis, right hip: Secondary | ICD-10-CM | POA: Diagnosis not present

## 2019-02-08 DIAGNOSIS — M25551 Pain in right hip: Secondary | ICD-10-CM

## 2019-02-08 NOTE — Patient Instructions (Signed)
   Vitamin D3:  5,000 IU daily  Vitamin K2:  100 mcg daily  Magnesium:  200-400 mg daily    

## 2019-02-08 NOTE — Progress Notes (Signed)
Office Visit Note   Patient: Barbara Bradford           Date of Birth: July 04, 1961           MRN: [id number] Visit Date: 02/08/2019 Requested by: Delsa Grana, PA-C [address],  [place] [address] PCP: Delsa Grana, PA-C  Subjective: Chief Complaint  Patient presents with  . Right Hip - Pain, Follow-up    HPI: She is here with right groin pain.  She was scheduled for follow-up with Dr. Ninfa Linden today but he asked me to see her.  She had a trochanteric bursa injection recently which did not help at all.  She states that her hip started hurting a few weeks ago while on vacation in [place].  She decided to go running with her daughter.  She has not been a runner for a long time.  She ran 3-1/2 miles and about halfway through she started feeling pain in the groin area.  Now it hurts anytime she moves her hip in certain way.  No history of stress fractures, no history of bone density test.  She is not a smoker.              ROS: No fevers or chills.  All other systems were reviewed and are negative.  Objective: Vital Signs: There were no vitals taken for this visit.  Physical Exam:  General:  Alert and oriented, in no acute distress. Pulm:  Breathing unlabored. Psy:  Normal mood, congruent affect. Skin: No rash on her skin. Female chaperone was present. Right hip: No significant tenderness over the greater trochanter today.  Very tender to palpation on the lateral margin of the pubic symphysis.  Also tender directly over the anterior hip joint.  Good range of motion but she does have pain with passive flexion and internal rotation.   Imaging: Musculoskeletal ultrasound: Limited diagnostic examination shows a small hip joint effusion with no obvious stress fracture over the anterior aspect of the femoral neck.  At the pubic symphysis there is no sign of muscle or tendon tear.  No elevation of the bony cortex.  No hyperemia on power Doppler imaging.  Assessment & Plan: 1.   Right groin pain, with small effusion on ultrasound, but exam concerning for stress reaction of pubic symphysis -Discussed with patient and elected to try a diagnostic injection into the right hip.  If no improvement by mid week, she will contact me for MRI pelvis.     Procedures: Subjective: Patient is here for ultrasound-guided intra-articular right hip injection.     Procedure: Ultrasound-guided right hip injection: After sterile prep with Betadine, injected 8 cc 1% lidocaine without epinephrine and 40 mg methylprednisolone using a 22-gauge spinal needle, passing the needle through the iliofemoral ligament into the femoral head/neck junction.  Injectate was seen filling the joint capsule.  She had no significant improvement during the immediate anesthetic phase.    PMFS History: Patient Active Problem List   Diagnosis Date Noted  . Diverticulosis 06/27/2018  . Anxiety and depression 06/27/2018  . Other abnormal glucose 06/27/2018  . Hyperlipemia 04/21/2018  . Overweight (BMI 25.0-29.9) 04/16/2018  . Insomnia 04/16/2018  . Constipation 07/10/2010   Past Medical History:  Diagnosis Date  . Abdominal bloating   . Diverticulosis    history of  . Gas    at times  . Hx of abnormal cervical Pap smear     Family History  Problem Relation Age of Onset  .  Dementia Mother   . Diabetes Mother   . Stroke Father   . Pancreatic cancer Sister     Past Surgical History:  Procedure Laterality Date  . ABDOMINAL HYSTERECTOMY     has part of one ovary  . abdominaplasty  2000  . AUGMENTATION MAMMAPLASTY Bilateral    implants removed a year ago  . BREAST ENHANCEMENT SURGERY    . CLAVICLE SURGERY     left side/had pins removed after a fracture  . hx coloposcopy     Social History   Occupational History  . Not on file  Tobacco Use  . Smoking status: Former Smoker    Types: Cigarettes    Quit date: 04/16/1997    Years since quitting: 21.8  . Smokeless tobacco: Never Used   Substance and Sexual Activity  . Alcohol use: Yes    Alcohol/week: 2.0 standard drinks    Types: 2 Cans of beer per week  . Drug use: No  . Sexual activity: Yes    Partners: Male    Birth control/protection: None, Post-menopausal

## 2019-02-11 ENCOUNTER — Ambulatory Visit: Payer: 59 | Admitting: Physician Assistant

## 2019-02-15 ENCOUNTER — Telehealth: Payer: Self-pay | Admitting: Family Medicine

## 2019-02-15 DIAGNOSIS — M25551 Pain in right hip: Secondary | ICD-10-CM

## 2019-02-15 NOTE — Telephone Encounter (Signed)
MRI ordered

## 2019-02-15 NOTE — Telephone Encounter (Signed)
Left full message on patient's voice mail - MRI has been ordered. The imaging facility will be giving her a call to set this up.

## 2019-02-15 NOTE — Telephone Encounter (Signed)
Patient saw Dr. Junius Roads on 02/08/2019 with an [place], and cortisone injection rt hip. Per patient, she is no better. She would like to proceed with MRI. Please call to advise.

## 2019-02-19 ENCOUNTER — Telehealth: Payer: Self-pay | Admitting: Family Medicine

## 2019-02-19 ENCOUNTER — Other Ambulatory Visit: Payer: Self-pay | Admitting: Family Medicine

## 2019-02-19 DIAGNOSIS — M25551 Pain in right hip: Secondary | ICD-10-CM

## 2019-02-19 NOTE — Telephone Encounter (Signed)
Patient called stated can get MRI @ [facility] end of July. She can get it sooner @ [facility] if order is put in,  Please cal patient to advise. [phone]

## 2019-02-19 NOTE — Telephone Encounter (Signed)
Patients brother came in and said that patient has been trying to call and see if her MRI order can be moved to [facility] states that they can ger her in sooner (she said like Sunday)  than [facility]. Patient would appreciate if someone could return her call

## 2019-02-19 NOTE — Telephone Encounter (Signed)
Called patient, explained pending authorization in clinical review with [employer]. Order # [id number]. I have still faxed order to [facility] to be scheduled. Patient works with [facility] and will contact insurance to keep check on authorization

## 2019-02-19 NOTE — Telephone Encounter (Signed)
Fleeta Emmer, could you assist the patient with this and notify her? Thanks for your help.

## 2019-02-19 NOTE — Telephone Encounter (Signed)
Patient called advised [facility] can not get her in until 03/06/2019. Patient said [facility] can get her in Sunday. Patient asked if the order can be faxed to them. The fax# is [phone]   Patient asked if the order can also be put in my chart. The number to contact patient is [phone]

## 2019-02-19 NOTE — Telephone Encounter (Signed)
See completed message on this from today Fleeta Emmer called the patient).

## 2019-02-22 ENCOUNTER — Ambulatory Visit (INDEPENDENT_AMBULATORY_CARE_PROVIDER_SITE_OTHER): Payer: 59

## 2019-02-22 ENCOUNTER — Other Ambulatory Visit: Payer: Self-pay

## 2019-02-22 DIAGNOSIS — M25551 Pain in right hip: Secondary | ICD-10-CM | POA: Diagnosis not present

## 2019-02-24 ENCOUNTER — Encounter: Payer: Self-pay | Admitting: Family Medicine

## 2019-02-24 ENCOUNTER — Telehealth: Payer: Self-pay | Admitting: Family Medicine

## 2019-02-24 DIAGNOSIS — M25551 Pain in right hip: Secondary | ICD-10-CM

## 2019-02-24 MED ORDER — TRAMADOL HCL 50 MG PO TABS: 50.0000 mg | ORAL_TABLET | Freq: Four times a day (QID) | ORAL | 0 refills | Status: DC | PRN

## 2019-02-24 NOTE — Addendum Note (Signed)
Addended by: Hortencia Pilar on: 02/24/2019 09:40 AM   Modules accepted: Orders

## 2019-02-24 NOTE — Telephone Encounter (Signed)
My Chart message sent

## 2019-03-06 ENCOUNTER — Other Ambulatory Visit: Payer: 59

## 2019-03-10 ENCOUNTER — Other Ambulatory Visit: Payer: Self-pay | Admitting: Family Medicine

## 2019-03-10 DIAGNOSIS — G4700 Insomnia, unspecified: Secondary | ICD-10-CM

## 2019-03-10 NOTE — Telephone Encounter (Signed)
Requested Prescriptions   Pending Prescriptions Disp Refills  . zolpidem (AMBIEN) 5 MG tablet [Pharmacy Med Name: ZOLPIDEM TARTRATE 5 MG TABLET] 30 tablet 1    Sig: TAKE 1 TABLET BY MOUTH AT BEDTIME AS NEEDED FOR SLEEP    Last OV 08/27/2018  Last written 12/24/2018

## 2019-03-19 ENCOUNTER — Other Ambulatory Visit: Payer: Self-pay | Admitting: Physician Assistant

## 2019-03-19 NOTE — Telephone Encounter (Signed)
Can you please advise?

## 2019-03-19 NOTE — Telephone Encounter (Signed)
Ok to refill 

## 2019-04-28 ENCOUNTER — Other Ambulatory Visit: Payer: Self-pay

## 2019-04-29 ENCOUNTER — Encounter: Payer: Self-pay | Admitting: Family Medicine

## 2019-04-29 ENCOUNTER — Ambulatory Visit (INDEPENDENT_AMBULATORY_CARE_PROVIDER_SITE_OTHER): Payer: 59 | Admitting: Family Medicine

## 2019-04-29 VITALS — BP 132/94 | HR 70 | Temp 98.5°F | Resp 14 | Ht 65.0 in | Wt 169.0 lb

## 2019-04-29 DIAGNOSIS — Z23 Encounter for immunization: Secondary | ICD-10-CM

## 2019-04-29 DIAGNOSIS — Z0000 Encounter for general adult medical examination without abnormal findings: Secondary | ICD-10-CM

## 2019-04-29 DIAGNOSIS — Z8262 Family history of osteoporosis: Secondary | ICD-10-CM

## 2019-04-29 NOTE — Progress Notes (Signed)
Subjective:    Patient ID: Barbara Bradford, female    DOB: [birth year], 58 y.o.   MRN: [id number]  HPI  Patient is a very pleasant 58 year old Caucasian female who is here today for complete physical exam.  She has no concerns.  Her blood pressure slightly elevated today at 132/94.  She states that she is never been told that she had high blood pressure in the past.  Her colonoscopy was performed in 2017 and they did find a benign polyp.  Therefore she is well up-to-date on this.  She gets her Pap smear through her gynecologist.  Her last mammogram was in October of last year.  They have already contacted the patient to schedule again this year.  She has a family history of osteoporosis in her father and her mother.  She also recently fractured her pelvis running.  The injury that caused the fracture did not sound sufficient to cause a fractured bone which raises concern about possible osteoporosis and the patient as well.  She is not taking calcium or vitamin D.  She has never had a bone density test.  She is on cholesterol medication based on hyperlipidemia.  She is due today for fasting lab work. Past Medical History:  Diagnosis Date  . Abdominal bloating   . Diverticulosis    history of  . Gas    at times  . Hx of abnormal cervical Pap smear    Past Surgical History:  Procedure Laterality Date  . ABDOMINAL HYSTERECTOMY     has part of one ovary  . abdominaplasty  2000  . AUGMENTATION MAMMAPLASTY Bilateral    implants removed a year ago  . BREAST ENHANCEMENT SURGERY    . CLAVICLE SURGERY     left side/had pins removed after a fracture  . hx coloposcopy     Current Outpatient Medications on File Prior to Visit  Medication Sig Dispense Refill  . atorvastatin (LIPITOR) 10 MG tablet Take 1 tablet (10 mg total) by mouth daily. 90 tablet 3  . diclofenac (VOLTAREN) 75 MG EC tablet TAKE 1 TABLET BY MOUTH TWICE A DAY 60 tablet 1  . linaclotide (LINZESS) 145 MCG CAPS capsule Take 1  capsule (145 mcg total) by mouth daily before breakfast. 30 capsule 2  . traMADol (ULTRAM) 50 MG tablet Take 1 tablet (50 mg total) by mouth every 6 (six) hours as needed. 20 tablet 0  . Wheat Dextrin (BENEFIBER) POWD Use tablespoon three times per day one bottle 350 GMS 350 g 10  . zolpidem (AMBIEN) 5 MG tablet TAKE 1 TABLET BY MOUTH AT BEDTIME AS NEEDED FOR SLEEP 30 tablet 1   No current facility-administered medications on file prior to visit.    No Known Allergies Social History   Socioeconomic History  . Marital status: Married    Spouse name: Not on file  . Number of children: 2  . Years of education: Not on file  . Highest education level: Not on file  Occupational History  . Not on file  Social Needs  . Financial resource strain: Not on file  . Food insecurity    Worry: Not on file    Inability: Not on file  . Transportation needs    Medical: Not on file    Non-medical: Not on file  Tobacco Use  . Smoking status: Former Smoker    Types: Cigarettes    Quit date: 04/16/1997    Years since quitting: 22.0  . Smokeless tobacco: Never  Used  Substance and Sexual Activity  . Alcohol use: Yes    Alcohol/week: 2.0 standard drinks    Types: 2 Cans of beer per week  . Drug use: No  . Sexual activity: Yes    Partners: Male    Birth control/protection: None, Post-menopausal  Lifestyle  . Physical activity    Days per week: 4 days    Minutes per session: 90 min  . Stress: Not on file  Relationships  . Social Herbalist on phone: Not on file    Gets together: Not on file    Attends religious service: Not on file    Active member of club or organization: Not on file    Attends meetings of clubs or organizations: Not on file    Relationship status: Not on file  . Intimate partner violence    Fear of current or ex partner: Not on file    Emotionally abused: Not on file    Physically abused: Not on file    Forced sexual activity: Not on file  Other Topics Concern   . Not on file  Social History Narrative  . Not on file   Family History  Problem Relation Age of Onset  . Dementia Mother   . Diabetes Mother   . Stroke Father   . Pancreatic cancer Sister      Review of Systems  All other systems reviewed and are negative.      Objective:   Physical Exam Vitals signs reviewed.  Constitutional:      General: She is not in acute distress.    Appearance: Normal appearance. She is normal weight. She is not ill-appearing, toxic-appearing or diaphoretic.  HENT:     Head: Normocephalic and atraumatic.     Right Ear: Tympanic membrane, ear canal and external ear normal. There is no impacted cerumen.     Left Ear: Tympanic membrane, ear canal and external ear normal. There is no impacted cerumen.     Nose: Nose normal. No congestion or rhinorrhea.     Mouth/Throat:     Mouth: Mucous membranes are moist.     Pharynx: Oropharynx is clear. No oropharyngeal exudate or posterior oropharyngeal erythema.  Eyes:     General: No scleral icterus.       Right eye: No discharge.        Left eye: No discharge.     Extraocular Movements: Extraocular movements intact.     Conjunctiva/sclera: Conjunctivae normal.     Pupils: Pupils are equal, round, and reactive to light.  Neck:     Musculoskeletal: Normal range of motion and neck supple. No neck rigidity or muscular tenderness.     Vascular: No carotid bruit.  Cardiovascular:     Rate and Rhythm: Normal rate and regular rhythm.     Pulses: Normal pulses.     Heart sounds: Normal heart sounds. No murmur. No friction rub. No gallop.   Pulmonary:     Effort: Pulmonary effort is normal. No respiratory distress.     Breath sounds: Normal breath sounds. No stridor. No wheezing, rhonchi or rales.  Chest:     Chest wall: No tenderness.  Abdominal:     General: Abdomen is flat. Bowel sounds are normal. There is no distension.     Palpations: Abdomen is soft. There is no mass.     Tenderness: There is no  abdominal tenderness. There is no guarding or rebound.     Hernia: No  hernia is present.  Musculoskeletal: Normal range of motion.     Right lower leg: No edema.     Left lower leg: No edema.  Lymphadenopathy:     Cervical: No cervical adenopathy.  Skin:    General: Skin is warm.     Coloration: Skin is not jaundiced or pale.     Findings: No bruising, erythema, lesion or rash.  Neurological:     General: No focal deficit present.     Mental Status: She is alert and oriented to person, place, and time.     Cranial Nerves: No cranial nerve deficit.     Sensory: No sensory deficit.     Motor: No weakness.     Coordination: Coordination normal.     Gait: Gait normal.     Deep Tendon Reflexes: Reflexes normal.  Psychiatric:        Mood and Affect: Mood normal.        Behavior: Behavior normal.        Thought Content: Thought content normal.        Judgment: Judgment normal.           Assessment & Plan:  Family history of disuse osteoporosis - Plan: DG Bone Density  General medical exam - Plan: CBC with Differential/Platelet, COMPLETE METABOLIC PANEL WITH GFR, Lipid panel  Physical exam today is completely normal aside from the mild elevation in her blood pressure.  I have asked the patient to check her blood pressure at home and notify me the values in 1 week.  If consistently greater than 140/90, I would recommend hydrochlorothiazide.  I will check a CBC, CMP, fasting lipid panel.  I will also schedule the patient for a bone density given her family history of osteoporosis and her recent nontraumatic pelvic fracture.  I have recommended 1000 units of vitamin D a day and 1200 mg of calcium a day.  Patient received her flu shot today.  Also recommended the shingles vaccine if covered by her insurance.  Colonoscopy, Pap smear, and mammogram are up-to-date.

## 2019-04-29 NOTE — Addendum Note (Signed)
Addended by: Shary Decamp B on: 04/29/2019 10:32 AM   Modules accepted: Orders

## 2019-04-30 LAB — COMPLETE METABOLIC PANEL WITH GFR
AG Ratio: 1.6 (calc) (ref 1.0–2.5)
ALT: 25 U/L (ref 6–29)
AST: 22 U/L (ref 10–35)
Albumin: 4.2 g/dL (ref 3.6–5.1)
Alkaline phosphatase (APISO): 64 U/L (ref 37–153)
BUN: 22 mg/dL (ref 7–25)
CO2: 27 mmol/L (ref 20–32)
Calcium: 9.4 mg/dL (ref 8.6–10.4)
Chloride: 105 mmol/L (ref 98–110)
Creat: 0.89 mg/dL (ref 0.50–1.05)
GFR, Est African American: 84 mL/min/{1.73_m2} (ref >=60)
GFR, Est Non African American: 72 mL/min/{1.73_m2} (ref >=60)
Globulin: 2.6 g/dL (calc) (ref 1.9–3.7)
Glucose, Bld: 91 mg/dL (ref 65–99)
Potassium: 4.4 mmol/L (ref 3.5–5.3)
Sodium: 140 mmol/L (ref 135–146)
Total Bilirubin: 0.6 mg/dL (ref 0.2–1.2)
Total Protein: 6.8 g/dL (ref 6.1–8.1)

## 2019-04-30 LAB — CBC WITH DIFFERENTIAL/PLATELET
Absolute Monocytes: 502 cells/uL (ref 200–950)
Basophils Absolute: 22 cells/uL (ref 0–200)
Basophils Relative: 0.4 %
Eosinophils Absolute: 81 cells/uL (ref 15–500)
Eosinophils Relative: 1.5 %
HCT: 40.7 % (ref 35.0–45.0)
Hemoglobin: 13.7 g/dL (ref 11.7–15.5)
Lymphs Abs: 1247 cells/uL (ref 850–3900)
MCH: 29.6 pg (ref 27.0–33.0)
MCHC: 33.7 g/dL (ref 32.0–36.0)
MCV: 87.9 fL (ref 80.0–100.0)
MPV: 10.1 fL (ref 7.5–12.5)
Monocytes Relative: 9.3 %
Neutro Abs: 3548 cells/uL (ref 1500–7800)
Neutrophils Relative %: 65.7 %
Platelets: 217 10*3/uL (ref 140–400)
RBC: 4.63 10*6/uL (ref 3.80–5.10)
RDW: 12.9 % (ref 11.0–15.0)
Total Lymphocyte: 23.1 %
WBC: 5.4 10*3/uL (ref 3.8–10.8)

## 2019-04-30 LAB — LIPID PANEL
Cholesterol: 191 mg/dL (ref <200)
HDL: 82 mg/dL (ref >=50)
LDL Cholesterol (Calc): 95 mg/dL (calc)
Non-HDL Cholesterol (Calc): 109 mg/dL (calc) (ref <130)
Total CHOL/HDL Ratio: 2.3 (calc) (ref <5.0)
Triglycerides: 59 mg/dL (ref <150)

## 2019-05-10 ENCOUNTER — Other Ambulatory Visit: Payer: Self-pay | Admitting: Physician Assistant

## 2019-05-17 ENCOUNTER — Other Ambulatory Visit: Payer: Self-pay | Admitting: Family Medicine

## 2019-05-17 DIAGNOSIS — E785 Hyperlipidemia, unspecified: Secondary | ICD-10-CM

## 2019-05-17 MED ORDER — ATORVASTATIN CALCIUM 10 MG PO TABS: 10.0000 mg | ORAL_TABLET | Freq: Every day | ORAL | 3 refills | Status: DC

## 2019-05-24 ENCOUNTER — Other Ambulatory Visit: Payer: Self-pay | Admitting: Family Medicine

## 2019-05-24 DIAGNOSIS — G4700 Insomnia, unspecified: Secondary | ICD-10-CM

## 2019-06-21 ENCOUNTER — Other Ambulatory Visit (HOSPITAL_COMMUNITY): Payer: Self-pay | Admitting: Family Medicine

## 2019-06-21 DIAGNOSIS — Z1231 Encounter for screening mammogram for malignant neoplasm of breast: Secondary | ICD-10-CM

## 2019-06-21 DIAGNOSIS — Z780 Asymptomatic menopausal state: Secondary | ICD-10-CM

## 2019-07-12 ENCOUNTER — Encounter: Payer: Self-pay | Admitting: Orthopaedic Surgery

## 2019-07-12 ENCOUNTER — Ambulatory Visit (INDEPENDENT_AMBULATORY_CARE_PROVIDER_SITE_OTHER): Payer: 59 | Admitting: Orthopaedic Surgery

## 2019-07-12 ENCOUNTER — Other Ambulatory Visit: Payer: Self-pay

## 2019-07-12 DIAGNOSIS — M180 Bilateral primary osteoarthritis of first carpometacarpal joints: Secondary | ICD-10-CM | POA: Diagnosis not present

## 2019-07-12 MED ORDER — LIDOCAINE HCL 1 % IJ SOLN
1.0000 mL | INTRAMUSCULAR | Status: AC | PRN
  Administered 2019-07-12: 1 mL

## 2019-07-12 MED ORDER — METHYLPREDNISOLONE ACETATE 40 MG/ML IJ SUSP
40.0000 mg | INTRAMUSCULAR | Status: AC | PRN
  Administered 2019-07-12: 40 mg

## 2019-07-12 MED ORDER — METHYLPREDNISOLONE ACETATE 40 MG/ML IJ SUSP
40.0000 mg | INTRAMUSCULAR | Status: AC | PRN
  Administered 2019-07-12: 09:00:00 40 mg

## 2019-07-12 NOTE — Progress Notes (Signed)
Office Visit Note   Patient: Barbara Bradford           Date of Birth: 1961/06/04           MRN: [id number] Visit Date: 07/12/2019              Requested by: Susy Frizzle, MD [address] [facility] [place],  [place] [address] PCP: Susy Frizzle, MD   Assessment & Plan: Visit Diagnoses:  1. Primary osteoarthritis of both first carpometacarpal joints     Plan: Her clinical exam as well as signs and symptoms seems to be more consistent with basilar thumb joint arthritis bilaterally.  I talked her about Voltaren gel trying this 2-3 times a day as well as a steroid injection of the round the basilar thumb joint.  I explained the risk and benefits of injections.  She agreed to try this treatment plan and tolerated the injections well.  All question concerns were answered and addressed.  We will see her back in about 6 weeks to see how she is doing overall.  If she still having pain I would like 3 views of both hands.  Follow-Up Instructions: Return in about 6 weeks (around 08/23/2019).   Orders:  Orders Placed This Encounter  Procedures  . Hand/UE Inj  . Hand/UE Inj   No orders of the defined types were placed in this encounter.     Procedures: Hand/UE Inj: R thumb CMC for osteoarthritis on 07/12/2019 9:11 AM Medications: 1 mL lidocaine 1 %; 40 mg methylPREDNISolone acetate 40 MG/ML  Hand/UE Inj: L thumb CMC for osteoarthritis on 07/12/2019 9:11 AM Medications: 1 mL lidocaine 1 %; 40 mg methylPREDNISolone acetate 40 MG/ML      Clinical Data: No additional findings.   Subjective: Chief Complaint  Patient presents with  . Right Hand - Pain  . Left Hand - Pain  The patient is well-known to our clinic.  She does come in with a new issue.  She has had pain in both of her hands for about a year now.  She wonders if this was carpal tunnel.  Is painful at night and holding certain things causes a lot of pain.  She denies any numbness and tingling.  She did work typing for  many years.  She points to the base of her thumb as the pain she is experiencing on both sides.  She is not diabetic and not a smoker.  This has been slowly getting worse.  HPI  Review of Systems She currently denies any headache, chest pain, shortness of breath, fever, chills, nausea, vomiting  Objective: Vital Signs: There were no vitals taken for this visit.  Physical Exam She is alert and orient x3 and in no acute distress Ortho Exam Examination of both hands shows no muscle atrophy.  Her sensation is normal.  She does seem to have a weak grip strength bilaterally.  She has a negative Phalen's or Tinel's sign.  She has severe pain with manipulation of the basilar thumb joint bilaterally with positive grind test.  The left is worse than the right.  There is some pain over the radial styloid on both hands. Specialty Comments:  No specialty comments available.  Imaging: No results found.   PMFS History: Patient Active Problem List   Diagnosis Date Noted  . Diverticulosis 06/27/2018  . Anxiety and depression 06/27/2018  . Other abnormal glucose 06/27/2018  . Hyperlipemia 04/21/2018  . Overweight (BMI 25.0-29.9) 04/16/2018  .  Insomnia 04/16/2018  . Constipation 07/10/2010   Past Medical History:  Diagnosis Date  . Abdominal bloating   . Diverticulosis    history of  . Gas    at times  . Hx of abnormal cervical Pap smear     Family History  Problem Relation Age of Onset  . Dementia Mother   . Diabetes Mother   . Stroke Father   . Pancreatic cancer Sister     Past Surgical History:  Procedure Laterality Date  . ABDOMINAL HYSTERECTOMY     has part of one ovary  . abdominaplasty  2000  . AUGMENTATION MAMMAPLASTY Bilateral    implants removed a year ago  . BREAST ENHANCEMENT SURGERY    . CLAVICLE SURGERY     left side/had pins removed after a fracture  . hx coloposcopy     Social History   Occupational History  . Not on file  Tobacco Use  . Smoking status:  Former Smoker    Types: Cigarettes    Quit date: 04/16/1997    Years since quitting: 22.2  . Smokeless tobacco: Never Used  Substance and Sexual Activity  . Alcohol use: Yes    Alcohol/week: 2.0 standard drinks    Types: 2 Cans of beer per week  . Drug use: No  . Sexual activity: Yes    Partners: Male    Birth control/protection: None, Post-menopausal

## 2019-07-14 ENCOUNTER — Ambulatory Visit (HOSPITAL_COMMUNITY)
Admission: RE | Admit: 2019-07-14 | Discharge: 2019-07-14 | Disposition: A | Discharge status: Home / Self Care | Payer: 59 | Source: Ambulatory Visit | Attending: Family Medicine | Admitting: Family Medicine

## 2019-07-14 ENCOUNTER — Other Ambulatory Visit (HOSPITAL_COMMUNITY): Payer: Self-pay | Admitting: Family Medicine

## 2019-07-14 ENCOUNTER — Encounter (HOSPITAL_COMMUNITY): Payer: Self-pay

## 2019-07-14 ENCOUNTER — Other Ambulatory Visit: Payer: Self-pay

## 2019-07-14 DIAGNOSIS — Z1231 Encounter for screening mammogram for malignant neoplasm of breast: Secondary | ICD-10-CM

## 2019-07-14 DIAGNOSIS — R921 Mammographic calcification found on diagnostic imaging of breast: Secondary | ICD-10-CM

## 2019-07-14 DIAGNOSIS — Z780 Asymptomatic menopausal state: Secondary | ICD-10-CM | POA: Diagnosis not present

## 2019-07-15 ENCOUNTER — Encounter: Payer: Self-pay | Admitting: Family Medicine

## 2019-07-15 DIAGNOSIS — M8580 Other specified disorders of bone density and structure, unspecified site: Secondary | ICD-10-CM | POA: Insufficient documentation

## 2019-07-20 ENCOUNTER — Other Ambulatory Visit: Payer: Self-pay

## 2019-07-20 ENCOUNTER — Ambulatory Visit (HOSPITAL_COMMUNITY): Payer: 59

## 2019-07-20 ENCOUNTER — Ambulatory Visit (HOSPITAL_COMMUNITY)
Admission: RE | Admit: 2019-07-20 | Discharge: 2019-07-20 | Disposition: A | Discharge status: Home / Self Care | Payer: 59 | Source: Ambulatory Visit | Attending: Family Medicine | Admitting: Family Medicine

## 2019-07-20 DIAGNOSIS — R921 Mammographic calcification found on diagnostic imaging of breast: Secondary | ICD-10-CM | POA: Diagnosis not present

## 2019-07-27 ENCOUNTER — Other Ambulatory Visit: Payer: Self-pay | Admitting: Family Medicine

## 2019-07-27 DIAGNOSIS — G4700 Insomnia, unspecified: Secondary | ICD-10-CM

## 2019-08-12 ENCOUNTER — Other Ambulatory Visit: Payer: 59

## 2019-08-23 ENCOUNTER — Ambulatory Visit: Payer: 59 | Admitting: Orthopaedic Surgery

## 2019-08-27 ENCOUNTER — Other Ambulatory Visit: Payer: Self-pay | Admitting: Family Medicine

## 2019-08-27 DIAGNOSIS — K5900 Constipation, unspecified: Secondary | ICD-10-CM

## 2019-10-07 ENCOUNTER — Other Ambulatory Visit: Payer: Self-pay | Admitting: Family Medicine

## 2019-10-07 DIAGNOSIS — G4700 Insomnia, unspecified: Secondary | ICD-10-CM

## 2019-10-07 MED ORDER — ZOLPIDEM TARTRATE 5 MG PO TABS: 5.0000 mg | ORAL_TABLET | Freq: Every evening | ORAL | 1 refills | Status: DC | PRN

## 2019-10-07 NOTE — Telephone Encounter (Signed)
Requesting refill    Ambien  LOV: 04/29/19  LRF:  07/27/19

## 2019-12-10 ENCOUNTER — Other Ambulatory Visit: Payer: Self-pay | Admitting: Family Medicine

## 2019-12-10 DIAGNOSIS — G4700 Insomnia, unspecified: Secondary | ICD-10-CM

## 2019-12-10 NOTE — Telephone Encounter (Signed)
Requesting refill  Ambien    LOV: 04/29/2019  LRF:  [id number]

## 2020-02-10 ENCOUNTER — Other Ambulatory Visit: Payer: Self-pay | Admitting: Family Medicine

## 2020-02-10 DIAGNOSIS — G4700 Insomnia, unspecified: Secondary | ICD-10-CM

## 2020-02-10 NOTE — Telephone Encounter (Signed)
Requested Prescriptions   Pending Prescriptions Disp Refills  . zolpidem (AMBIEN) 5 MG tablet [Pharmacy Med Name: ZOLPIDEM TARTRATE 5 MG TABLET] 30 tablet 1    Sig: TAKE 1 TABLET BY MOUTH EVERY DAY AT BEDTIME AS NEEDED FOR SLEEP     Last OV 04/29/2019   Last ordered 12/10/2019

## 2020-02-17 ENCOUNTER — Other Ambulatory Visit: Payer: Self-pay

## 2020-02-17 ENCOUNTER — Ambulatory Visit (INDEPENDENT_AMBULATORY_CARE_PROVIDER_SITE_OTHER): Payer: 59 | Admitting: Nurse Practitioner

## 2020-02-17 VITALS — HR 69 | Temp 97.6°F | Resp 18 | Wt 158.4 lb

## 2020-02-17 DIAGNOSIS — S0300XA Dislocation of jaw, unspecified side, initial encounter: Secondary | ICD-10-CM

## 2020-02-17 MED ORDER — PREDNISONE 10 MG PO TABS: ORAL_TABLET | ORAL | 0 refills | Status: DC

## 2020-02-17 NOTE — Patient Instructions (Addendum)
Drink plenty of water   Eat a diet low in fat and cholesterol carbohydrates and sugars. Limit sodium to 2 Grams per day.   Blood pressure goals 140/90 or less.   Your annual wellness exam id due in September.   For TMJ may take over the counter ibuprofen 800 mg with full stomach or pepcid otc 20 mg every 8 hours. Prescription steroid as directed. Follow up as needed for non resolving or worsening sxs.  Wear your night guard.

## 2020-02-17 NOTE — Progress Notes (Signed)
Established Patient Office Visit  Subjective:  Patient ID: Barbara Bradford, female    DOB: 1960-11-26  Age: 59 y.o. MRN: [id number]  CC:  Chief Complaint  Patient presents with  . Headache    L side of face from ear to teeth, started x2 weeks, hurts in jaw line    HPI Barbara Bradford is a 59 year old female presneting for complaints of pain to left jaw (TMJ area) the pain radiates to her right side of head. 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 The sxs started the next day and gradually worsened since then. She has tried a tylenol last night but no other treatments. She has no other sxs such as nausea, vomiting, visual changes, arm pain, popping sound heard, fever, chills.   Past Medical History:  Diagnosis Date  . Abdominal bloating   . Diverticulosis    history of  . Gas    at times  . Hx of abnormal cervical Pap smear   . Osteopenia     Past Surgical History:  Procedure Laterality Date  . ABDOMINAL HYSTERECTOMY     has part of one ovary  . abdominaplasty  2000  . AUGMENTATION MAMMAPLASTY Bilateral    implants removed a year ago  . BREAST ENHANCEMENT SURGERY    . CLAVICLE SURGERY     left side/had pins removed after a fracture  . hx coloposcopy    . REDUCTION MAMMAPLASTY Bilateral     Family History  Problem Relation Age of Onset  . Dementia Mother   . Diabetes Mother   . Stroke Father   . Pancreatic cancer Sister     Social History   Socioeconomic History  . Marital status: Married    Spouse name: Not on file  . Number of children: 2  . Years of education: Not on file  . Highest education level: Not on file  Occupational History  . Not on file  Tobacco Use  . Smoking status: Former Smoker    Types: Cigarettes    Quit date: 04/16/1997    Years since quitting: 22.8  . Smokeless tobacco: Never Used  Vaping Use  . Vaping  Use: Never used  Substance and Sexual Activity  . Alcohol use: Yes    Alcohol/week: 2.0 standard drinks    Types: 2 Cans of beer per week  . Drug use: No  . Sexual activity: Yes    Partners: Male    Birth control/protection: None, Post-menopausal  Other Topics Concern  . Not on file  Social History Narrative  . Not on file   Social Determinants of Health   Financial Resource Strain:   . Difficulty of Paying Living Expenses:   Food Insecurity:   . Worried About Charity fundraiser in the Last Year:   . Arboriculturist in the Last Year:   Transportation Needs:   . Film/video editor (Medical):   Marland Kitchen Lack of Transportation (Non-Medical):   Physical Activity:   . Days of Exercise per Week:   . Minutes of Exercise per Session:   Stress:   . Feeling of Stress :   Social Connections:   . Frequency of Communication with Friends and Family:   . Frequency of Social Gatherings with Friends and Family:   . Attends Religious Services:   .  Active Member of Clubs or Organizations:   . Attends Archivist Meetings:   Marland Kitchen Marital Status:   Intimate Partner Violence:   . Fear of Current or Ex-Partner:   . Emotionally Abused:   Marland Kitchen Physically Abused:   . Sexually Abused:     Outpatient Medications Prior to Visit  Medication Sig Dispense Refill  . atorvastatin (LIPITOR) 10 MG tablet Take 1 tablet (10 mg total) by mouth daily. 90 tablet 3  . Wheat Dextrin (BENEFIBER) POWD Use tablespoon three times per day one bottle 350 GMS 350 g 10  . zolpidem (AMBIEN) 5 MG tablet TAKE 1 TABLET BY MOUTH EVERY DAY AT BEDTIME AS NEEDED FOR SLEEP 30 tablet 1  . diclofenac (VOLTAREN) 75 MG EC tablet TAKE 1 TABLET BY MOUTH TWICE A DAY 60 tablet 1  . LINZESS 145 MCG CAPS capsule TAKE 1 CAPSULE BY MOUTH DAILY BEFORE BREAKFAST. (Patient not taking: Reported on 02/17/2020) 30 capsule 2  . traMADol (ULTRAM) 50 MG tablet Take 1 tablet (50 mg total) by mouth every 6 (six) hours as needed. 20 tablet 0   No  facility-administered medications prior to visit.    No Known Allergies  ROS Review of Systems  All other systems reviewed and are negative.     Objective:    Physical Exam Vitals and nursing note reviewed.  Constitutional:      Appearance: Normal appearance. She is well-developed.  HENT:     Head: Normocephalic.     Left Ear: Tympanic membrane, ear canal and external ear normal.     Nose: Nose normal.     Mouth/Throat:     Mouth: Mucous membranes are moist.     Pharynx: Oropharynx is clear.  Eyes:     General: No visual field deficit or scleral icterus.    Extraocular Movements: Extraocular movements intact.     Right eye: Normal extraocular motion.     Left eye: Normal extraocular motion.     Conjunctiva/sclera: Conjunctivae normal.     Pupils: Pupils are equal, round, and reactive to light. Pupils are equal.  Cardiovascular:     Rate and Rhythm: Normal rate.  Pulmonary:     Effort: Pulmonary effort is normal.  Abdominal:     General: There is no distension.     Tenderness: There is no guarding.  Musculoskeletal:        General: Normal range of motion.     Cervical back: Normal range of motion and neck supple.  Skin:    General: Skin is warm and dry.     Coloration: Skin is not cyanotic, jaundiced or pale.     Findings: No bruising or rash.  Neurological:     General: No focal deficit present.     Mental Status: She is alert and oriented to person, place, and time.     GCS: GCS eye subscore is 4. GCS verbal subscore is 5. GCS motor subscore is 6.     Cranial Nerves: Cranial nerves are intact. No cranial nerve deficit, dysarthria or facial asymmetry.     Sensory: Sensation is intact. No sensory deficit.     Motor: Motor function is intact. No weakness.     Gait: Gait normal.     Deep Tendon Reflexes: Reflexes are normal and symmetric. Reflexes normal.  Psychiatric:        Attention and Perception: Attention and perception normal. She is attentive. She does not  perceive auditory or visual hallucinations.  Mood and Affect: Mood and affect normal. Mood is not anxious.        Speech: Speech normal. Speech is not delayed or slurred.        Behavior: Behavior normal. Behavior is not agitated.        Thought Content: Thought content normal.        Cognition and Memory: Cognition normal.        Judgment: Judgment normal.     Pulse 69   Temp 97.6 F (36.4 C) (Temporal)   Resp 18   Wt 158 lb 6.4 oz (71.8 kg)   SpO2 97%   BMI 26.36 kg/m  Wt Readings from Last 3 Encounters:  02/17/20 158 lb 6.4 oz (71.8 kg)  04/29/19 169 lb (76.7 kg)  08/27/18 165 lb (74.8 kg)     Health Maintenance Due  Topic Date Due  . COVID-19 Vaccine (1) Never done  . PAP SMEAR-Modifier  04/04/2019    There are no preventive care reminders to display for this patient.  No results found for: TSH Lab Results  Component Value Date   WBC 5.4 04/29/2019   HGB 13.7 04/29/2019   HCT 40.7 04/29/2019   MCV 87.9 04/29/2019   PLT 217 04/29/2019   Lab Results  Component Value Date   NA 140 04/29/2019   K 4.4 04/29/2019   CO2 27 04/29/2019   GLUCOSE 91 04/29/2019   BUN 22 04/29/2019   CREATININE 0.89 04/29/2019   BILITOT 0.6 04/29/2019   AST 22 04/29/2019   ALT 25 04/29/2019   PROT 6.8 04/29/2019   CALCIUM 9.4 04/29/2019   Lab Results  Component Value Date   CHOL 191 04/29/2019   Lab Results  Component Value Date   HDL 82 04/29/2019   Lab Results  Component Value Date   LDLCALC 95 04/29/2019   Lab Results  Component Value Date   TRIG 59 04/29/2019   Lab Results  Component Value Date   CHOLHDL 2.3 04/29/2019   No results found for: HGBA1C    Assessment & Plan:   Problem List Items Addressed This Visit    None    Visit Diagnoses    Dislocation of temporomandibular joint, initial encounter    -  Primary   Relevant Medications   predniSONE (DELTASONE) 10 MG tablet    For preventative Health reminder:  Drink plenty of water   Eat a  diet low in fat and cholesterol carbohydrates and sugars. Limit sodium to 2 Grams per day.   Blood pressure goals 140/90 or less.   Your annual wellness exam id due in September.   For TMJ may take over the counter ibuprofen 800 mg with full stomach or pepcid otc 20 mg every 8 hours. Prescription steroid as directed. Follow up as needed for non resolving or worsening sxs.  Wear your night guard.  Meds ordered this encounter  Medications  . predniSONE (DELTASONE) 10 MG tablet    Sig: Day one take six tablets oraly Day two take five tablets oraly Day three take four tablets oraly Day four take three tablets oraly Day five take two tablets oraly Day six take one tablet oraly    Dispense:  21 tablet    Refill:  0    Follow-up: Return in about 1 month (around 03/19/2020) for cpe due with labs.    Annie Main, FNP

## 2020-04-13 ENCOUNTER — Other Ambulatory Visit: Payer: Self-pay | Admitting: Family Medicine

## 2020-04-13 DIAGNOSIS — G4700 Insomnia, unspecified: Secondary | ICD-10-CM

## 2020-05-24 ENCOUNTER — Other Ambulatory Visit: Payer: Self-pay | Admitting: Family Medicine

## 2020-05-24 DIAGNOSIS — E785 Hyperlipidemia, unspecified: Secondary | ICD-10-CM

## 2020-06-15 ENCOUNTER — Other Ambulatory Visit: Payer: Self-pay | Admitting: Family Medicine

## 2020-06-15 DIAGNOSIS — G4700 Insomnia, unspecified: Secondary | ICD-10-CM

## 2020-06-15 NOTE — Telephone Encounter (Signed)
Please Advise

## 2020-07-10 ENCOUNTER — Other Ambulatory Visit: Payer: Self-pay

## 2020-07-10 ENCOUNTER — Ambulatory Visit (INDEPENDENT_AMBULATORY_CARE_PROVIDER_SITE_OTHER): Payer: 59 | Admitting: Family Medicine

## 2020-07-10 VITALS — BP 140/90 | HR 74 | Temp 97.3°F | Ht 67.0 in | Wt 168.0 lb

## 2020-07-10 DIAGNOSIS — M549 Dorsalgia, unspecified: Secondary | ICD-10-CM | POA: Diagnosis not present

## 2020-07-10 DIAGNOSIS — R091 Pleurisy: Secondary | ICD-10-CM | POA: Diagnosis not present

## 2020-07-10 MED ORDER — MELOXICAM 15 MG PO TABS: 15.0000 mg | ORAL_TABLET | Freq: Every day | ORAL | 0 refills | Status: DC

## 2020-07-10 MED ORDER — TIZANIDINE HCL 4 MG PO TABS: 4.0000 mg | ORAL_TABLET | Freq: Four times a day (QID) | ORAL | 0 refills | Status: DC | PRN

## 2020-07-10 NOTE — Progress Notes (Signed)
Subjective:    Patient ID: Barbara Bradford, female    DOB: February 26, 1961, 59 y.o.   MRN: [id number]  HPI Patient is a very pleasant 59 year old Caucasian female who reports pain in her back.  Is located just medial to her scapula on the right side.  Is a sharp intense pain.  It began Friday.  Thursday she drove to [facility] approximately 4 hours.  Friday the pain began.  She denies any specific injury that would have triggered it.  She states that it hurts to take a deep breath then.  It radiates occasionally from around her shoulder blade into her chest on the right side.  There is no visible rash to suggest shingles.  However it is tender to palpation in the muscles adjacent to her scapula and lateral to the spinous processes.  I can reproduce the pain with palpation.  She denies any shortness of breath.  She denies any hemoptysis.  She denies any cough.  She denies any leg swelling but she does have pleurisy.  She denies any fevers or chills.  She took a Covid test at home and this was negative. Past Medical History:  Diagnosis Date  . Abdominal bloating   . Diverticulosis    history of  . Gas    at times  . Hx of abnormal cervical Pap smear   . Osteopenia    Past Surgical History:  Procedure Laterality Date  . ABDOMINAL HYSTERECTOMY     has part of one ovary  . abdominaplasty  2000  . AUGMENTATION MAMMAPLASTY Bilateral    implants removed a year ago  . BREAST ENHANCEMENT SURGERY    . CLAVICLE SURGERY     left side/had pins removed after a fracture  . hx coloposcopy    . REDUCTION MAMMAPLASTY Bilateral    Current Outpatient Medications on File Prior to Visit  Medication Sig Dispense Refill  . atorvastatin (LIPITOR) 10 MG tablet TAKE 1 TABLET BY MOUTH EVERY DAY 30 tablet 11  . Wheat Dextrin (BENEFIBER) POWD Use tablespoon three times per day one bottle 350 GMS 350 g 10  . zolpidem (AMBIEN) 5 MG tablet TAKE 1 TABLET BY MOUTH AT BEDTIME AS NEEDED FOR SLEEP 30 tablet 1  .  LINZESS 145 MCG CAPS capsule TAKE 1 CAPSULE BY MOUTH DAILY BEFORE BREAKFAST. (Patient not taking: Reported on 02/17/2020) 30 capsule 2   No current facility-administered medications on file prior to visit.   No Known Allergies Social History   Socioeconomic History  . Marital status: Married    Spouse name: Not on file  . Number of children: 2  . Years of education: Not on file  . Highest education level: Not on file  Occupational History  . Not on file  Tobacco Use  . Smoking status: Former Smoker    Types: Cigarettes    Quit date: 04/16/1997    Years since quitting: 23.2  . Smokeless tobacco: Never Used  Vaping Use  . Vaping Use: Never used  Substance and Sexual Activity  . Alcohol use: Yes    Alcohol/week: 2.0 standard drinks    Types: 2 Cans of beer per week  . Drug use: No  . Sexual activity: Yes    Partners: Male    Birth control/protection: None, Post-menopausal  Other Topics Concern  . Not on file  Social History Narrative  . Not on file   Social Determinants of Health   Financial Resource Strain:   . Difficulty of Paying  Living Expenses: Not on file  Food Insecurity:   . Worried About Charity fundraiser in the Last Year: Not on file  . Ran Out of Food in the Last Year: Not on file  Transportation Needs:   . Lack of Transportation (Medical): Not on file  . Lack of Transportation (Non-Medical): Not on file  Physical Activity:   . Days of Exercise per Week: Not on file  . Minutes of Exercise per Session: Not on file  Stress:   . Feeling of Stress : Not on file  Social Connections:   . Frequency of Communication with Friends and Family: Not on file  . Frequency of Social Gatherings with Friends and Family: Not on file  . Attends Religious Services: Not on file  . Active Member of Clubs or Organizations: Not on file  . Attends Archivist Meetings: Not on file  . Marital Status: Not on file  Intimate Partner Violence:   . Fear of Current or  Ex-Partner: Not on file  . Emotionally Abused: Not on file  . Physically Abused: Not on file  . Sexually Abused: Not on file      Review of Systems  All other systems reviewed and are negative.      Objective:   Physical Exam Vitals reviewed.  Constitutional:      Appearance: Normal appearance.  Cardiovascular:     Rate and Rhythm: Normal rate and regular rhythm.     Heart sounds: Normal heart sounds. No murmur heard.   Pulmonary:     Effort: Pulmonary effort is normal.     Breath sounds: Normal breath sounds. No wheezing, rhonchi or rales.    Musculoskeletal:     Thoracic back: Tenderness present. No swelling, deformity, lacerations or bony tenderness. Normal range of motion.       Back:  Neurological:     Mental Status: She is alert.           Assessment & Plan:  Pleurisy - Plan: D-dimer, quantitative (not at [facility])  Mid back pain on right side  I believe this is most likely a strained muscle.  Therefore I will treat her with meloxicam 15 mg a day and Zanaflex 4 mg every 6 hours.  Also on the differential diagnosis would be shingles particularly given the radiation occasionally around her chest.  Thoracic radiculopathy is also  on the differential diagnosis.  She will monitor for any rash in that area to suggest shingles however there is no visible rash today.  Also given her recent car ride and pleurisy, certainly pulmonary embolism is on the differential and therefore I will check a D-dimer however she has no cough or shortness of breath and therefore I think this is less likely and if the D-dimer is negative I do not feel that further work-up is necessary.  Consider imaging if the pain persist.

## 2020-07-11 LAB — D-DIMER, QUANTITATIVE: D-Dimer, Quant: 0.19 mcg/mL FEU (ref <0.50)

## 2020-07-19 ENCOUNTER — Other Ambulatory Visit: Payer: Self-pay | Admitting: Family Medicine

## 2020-08-15 ENCOUNTER — Other Ambulatory Visit: Payer: Self-pay | Admitting: Family Medicine

## 2020-08-15 DIAGNOSIS — G4700 Insomnia, unspecified: Secondary | ICD-10-CM

## 2020-08-16 NOTE — Telephone Encounter (Signed)
Ok to refill??  Last office visit 07/10/2020.  Last refill 06/15/2020, #1 refill.

## 2020-09-04 ENCOUNTER — Encounter: Payer: Self-pay | Admitting: Family Medicine

## 2020-09-04 ENCOUNTER — Other Ambulatory Visit: Payer: Self-pay

## 2020-09-04 ENCOUNTER — Ambulatory Visit (INDEPENDENT_AMBULATORY_CARE_PROVIDER_SITE_OTHER): Payer: No Typology Code available for payment source | Admitting: Family Medicine

## 2020-09-04 VITALS — BP 140/80 | HR 71 | Temp 97.6°F | Ht 65.0 in | Wt 171.0 lb

## 2020-09-04 DIAGNOSIS — B9689 Other specified bacterial agents as the cause of diseases classified elsewhere: Secondary | ICD-10-CM | POA: Diagnosis not present

## 2020-09-04 DIAGNOSIS — J019 Acute sinusitis, unspecified: Secondary | ICD-10-CM | POA: Diagnosis not present

## 2020-09-04 MED ORDER — AMOXICILLIN-POT CLAVULANATE 875-125 MG PO TABS: 1.0000 | ORAL_TABLET | Freq: Two times a day (BID) | ORAL | 0 refills | Status: DC

## 2020-09-04 NOTE — Progress Notes (Signed)
Subjective:    Patient ID: Barbara Bradford, female    DOB: [birth year], 60 y.o.   MRN: [id number]  HPI Patient was diagnosed with Covid on January 8.  Her symptoms gradually improved over a 10-day..  Symptoms are classical including rhinorrhea, head congestion, nonproductive cough, fatigue, headaches, neck stiffness.  However his symptoms were only 100%.  4 days ago she started developing pain and pressure in her right maxillary sinus, her right ear, and swelling and tenderness in her right anterior cervical lymph nodes.  She denies any shortness of breath.  She denies any pleurisy.  She denies any fever.  She denies any cough or purulent sputum. Past Medical History:  Diagnosis Date  . Abdominal bloating   . Diverticulosis    history of  . Gas    at times  . Hx of abnormal cervical Pap smear   . Osteopenia    Past Surgical History:  Procedure Laterality Date  . ABDOMINAL HYSTERECTOMY     has part of one ovary  . abdominaplasty  2000  . AUGMENTATION MAMMAPLASTY Bilateral    implants removed a year ago  . BREAST ENHANCEMENT SURGERY    . CLAVICLE SURGERY     left side/had pins removed after a fracture  . hx coloposcopy    . REDUCTION MAMMAPLASTY Bilateral    Current Outpatient Medications on File Prior to Visit  Medication Sig Dispense Refill  . atorvastatin (LIPITOR) 10 MG tablet TAKE 1 TABLET BY MOUTH EVERY DAY 30 tablet 11  . meloxicam (MOBIC) 15 MG tablet TAKE 1 TABLET BY MOUTH EVERY DAY 30 tablet 0  . tiZANidine (ZANAFLEX) 4 MG tablet Take 1 tablet (4 mg total) by mouth every 6 (six) hours as needed for muscle spasms. 30 tablet 0  . Wheat Dextrin (BENEFIBER) POWD Use tablespoon three times per day one bottle 350 GMS 350 g 10  . zolpidem (AMBIEN) 5 MG tablet TAKE 1 TABLET BY MOUTH EVERY DAY AT BEDTIME AS NEEDED FOR SLEEP 30 tablet 1  . LINZESS 145 MCG CAPS capsule TAKE 1 CAPSULE BY MOUTH DAILY BEFORE BREAKFAST. (Patient not taking: No sig reported) 30 capsule 2   No  current facility-administered medications on file prior to visit.   No Known Allergies Social History   Socioeconomic History  . Marital status: Married    Spouse name: Not on file  . Number of children: 2  . Years of education: Not on file  . Highest education level: Not on file  Occupational History  . Not on file  Tobacco Use  . Smoking status: Former Smoker    Types: Cigarettes    Quit date: 04/16/1997    Years since quitting: 23.4  . Smokeless tobacco: Never Used  Vaping Use  . Vaping Use: Never used  Substance and Sexual Activity  . Alcohol use: Yes    Alcohol/week: 2.0 standard drinks    Types: 2 Cans of beer per week  . Drug use: No  . Sexual activity: Yes    Partners: Male    Birth control/protection: None, Post-menopausal  Other Topics Concern  . Not on file  Social History Narrative  . Not on file   Social Determinants of Health   Financial Resource Strain: Not on file  Food Insecurity: Not on file  Transportation Needs: Not on file  Physical Activity: Not on file  Stress: Not on file  Social Connections: Not on file  Intimate Partner Violence: Not on file  Review of Systems  All other systems reviewed and are negative.      Objective:   Physical Exam Vitals reviewed.  Constitutional:      Appearance: Normal appearance.  HENT:     Right Ear: No swelling or tenderness. No middle ear effusion. Tympanic membrane is injected. Tympanic membrane is not scarred or perforated.     Left Ear:  No middle ear effusion. Tympanic membrane is not injected.     Nose:     Right Sinus: Maxillary sinus tenderness present.  Cardiovascular:     Rate and Rhythm: Normal rate and regular rhythm.     Heart sounds: Normal heart sounds. No murmur heard.   Pulmonary:     Effort: Pulmonary effort is normal.     Breath sounds: Normal breath sounds. No wheezing, rhonchi or rales.  Musculoskeletal:     Thoracic back: Tenderness present. No swelling, deformity,  lacerations or bony tenderness. Normal range of motion.       Back:  Neurological:     Mental Status: She is alert.           Assessment & Plan:  Acute bacterial rhinosinusitis  Patient appears to have recovered from Covid however I believe she is developed a secondary bacterial sinus infection.  Treat with Augmentin 875 mg twice daily for 10 days and then reassess in 1 week if no better or sooner if worse

## 2020-10-16 ENCOUNTER — Other Ambulatory Visit: Payer: Self-pay | Admitting: Family Medicine

## 2020-10-16 DIAGNOSIS — G4700 Insomnia, unspecified: Secondary | ICD-10-CM

## 2020-12-28 IMAGING — MG DIGITAL SCREENING BILAT W/ TOMO W/ CAD
8 series · 8 of 24 positions shown · non-contrast
Comparison: Previous exam(s).

CLINICAL DATA: Screening.

EXAM:
DIGITAL SCREENING BILATERAL MAMMOGRAM WITH TOMO AND CAD

[L CC synth-2D]
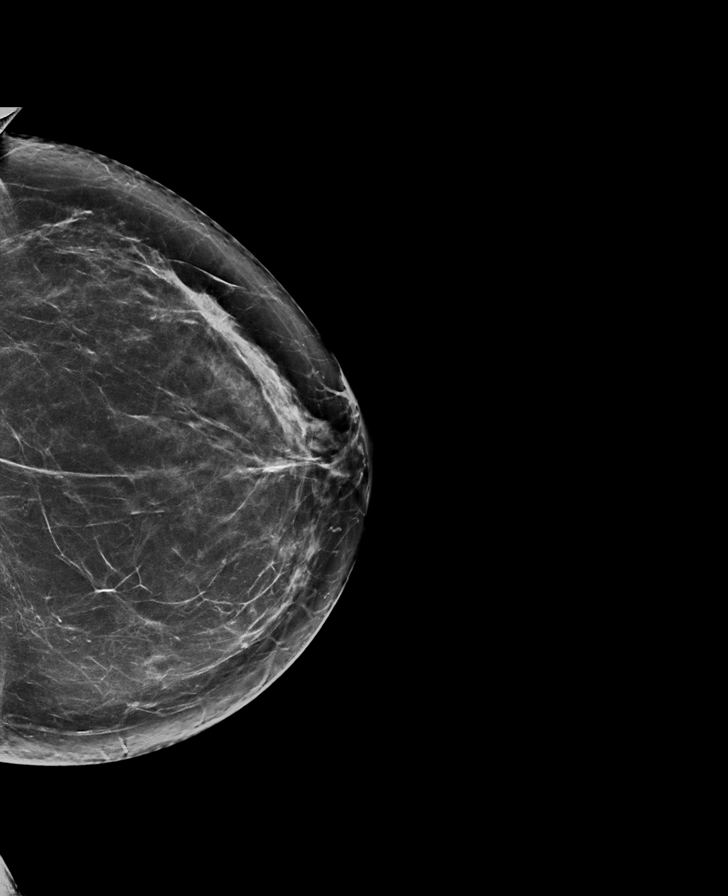

[L MLO synth-2D]
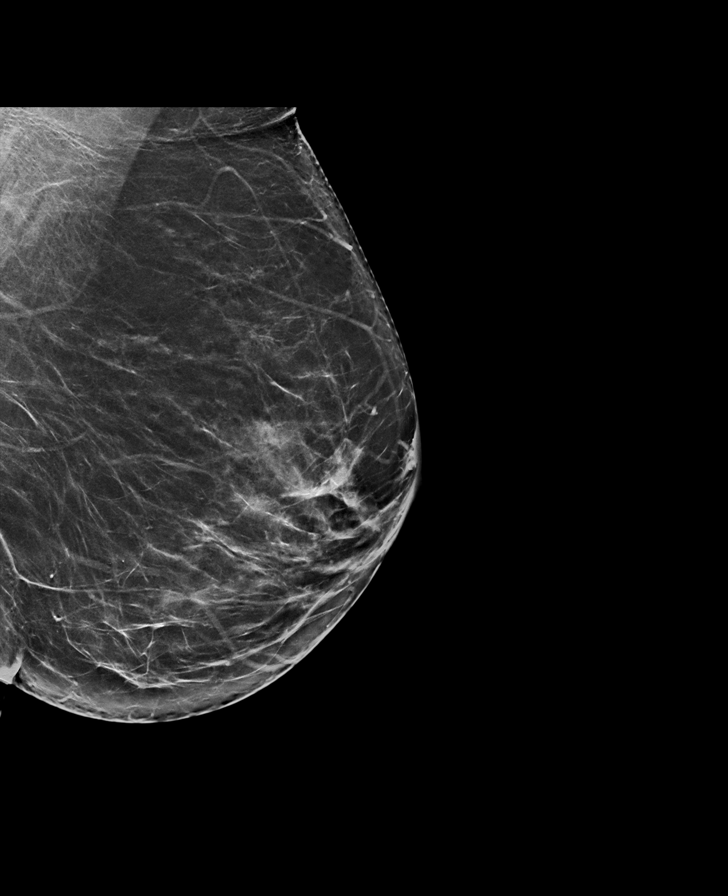

[R MLO synth-2D]
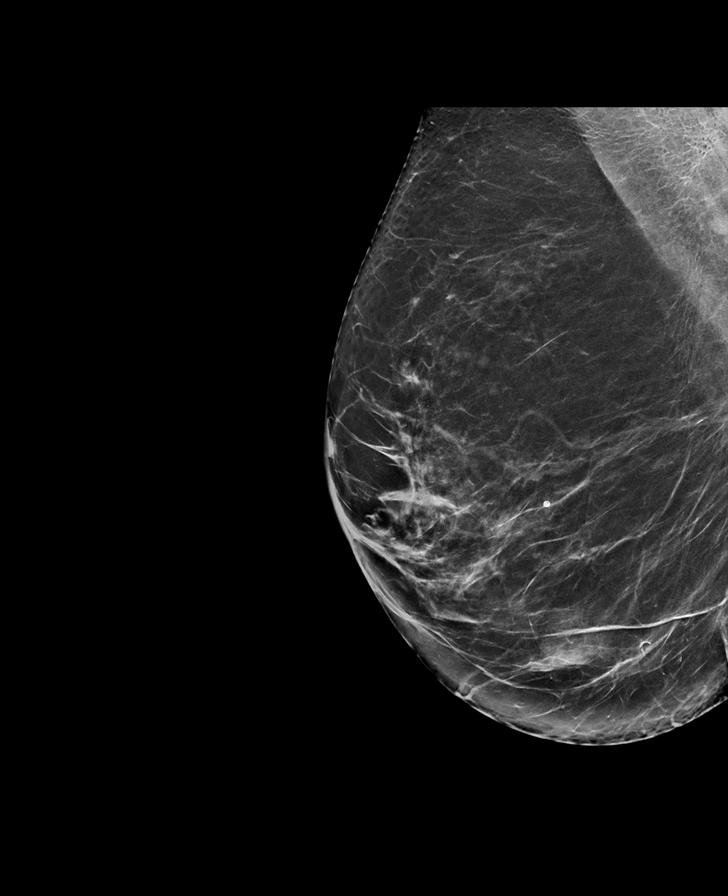

[R CC synth-2D]
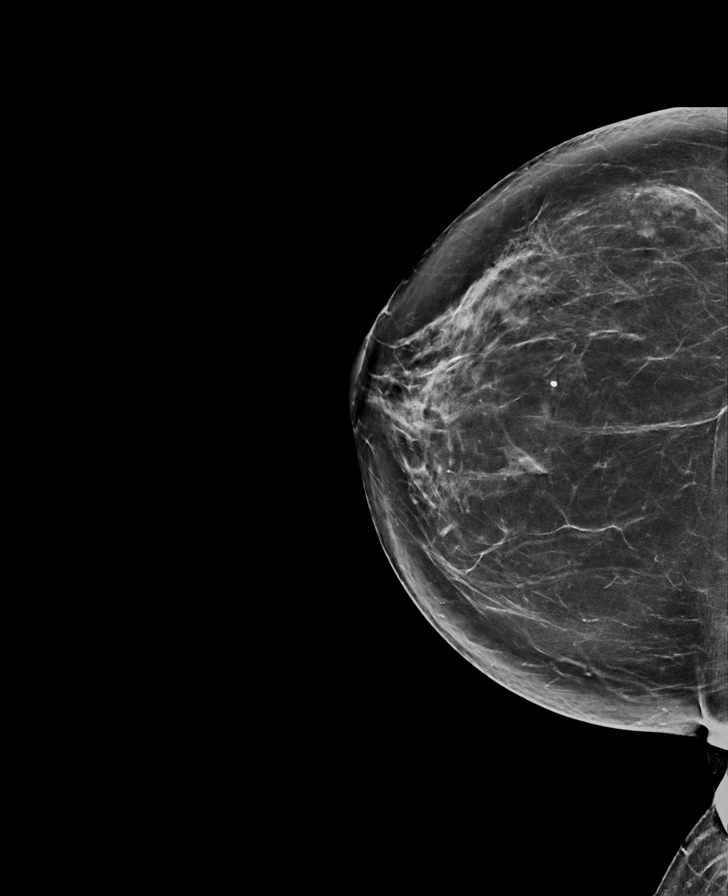

[L CC tomo · tomo slice 41/80.0]
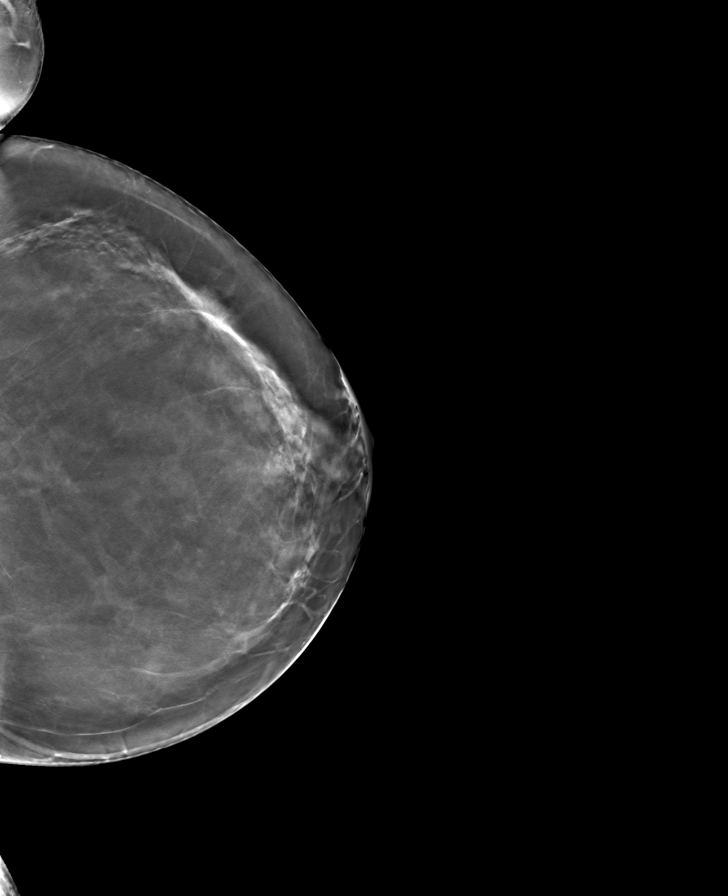

[R CC tomo · tomo slice 41/80.0]
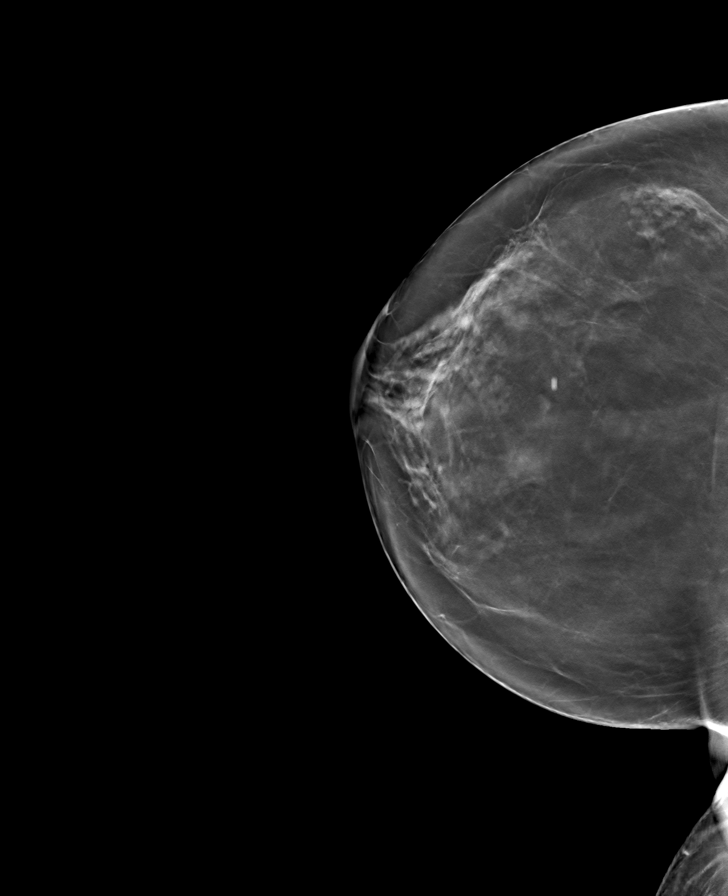

[R MLO tomo · tomo slice 40/79.0]
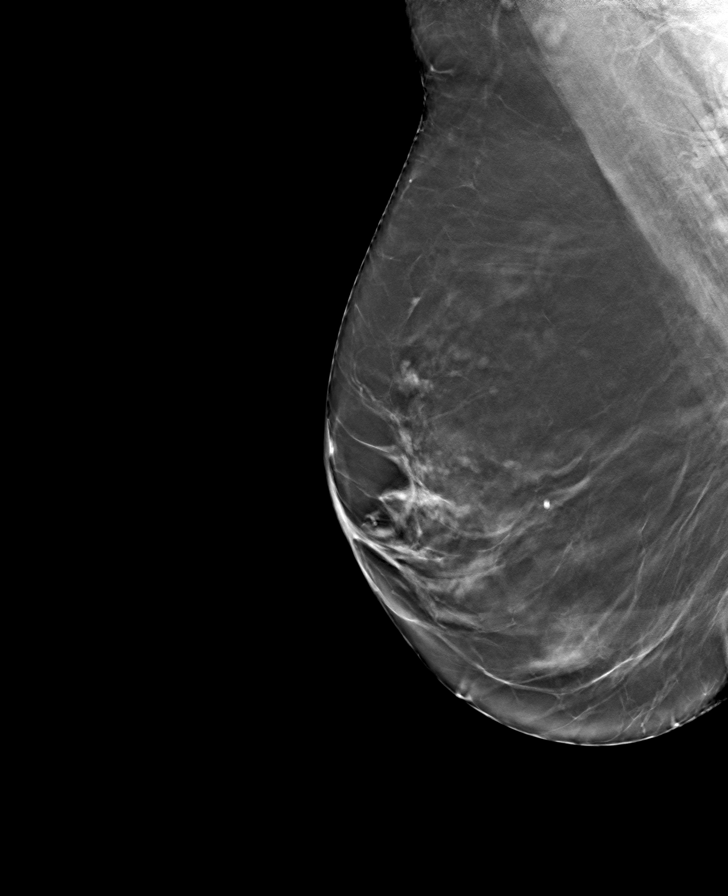

[L MLO tomo · tomo slice 41/80.0]
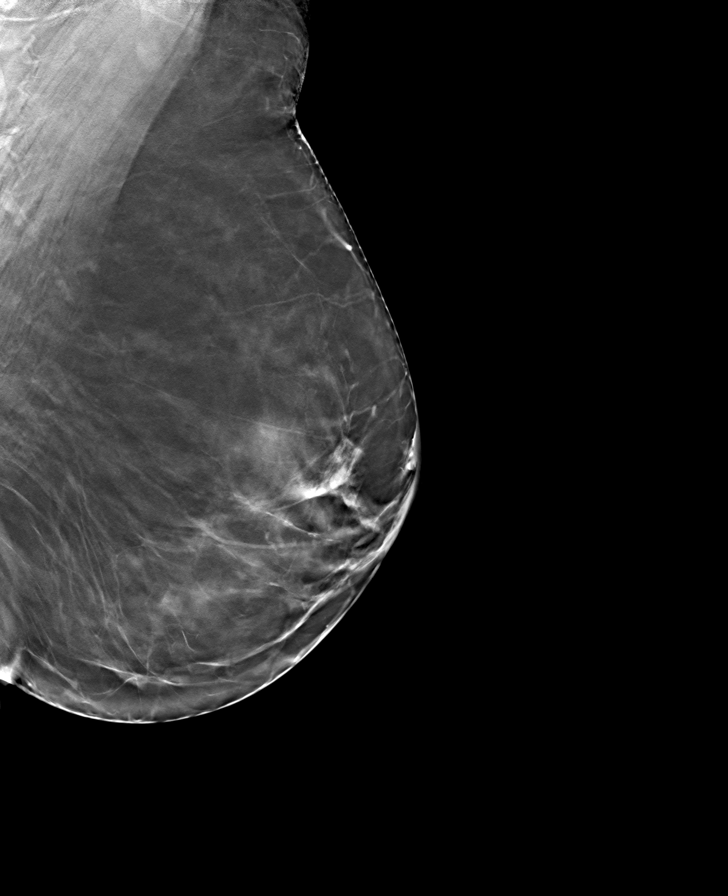

[8 of 24 positions shown; findings below may reference images not displayed]

ACR Breast Density Category b: There are scattered areas of
fibroglandular density.
FINDINGS: In the right breast, calcifications warrant further evaluation with
magnified views. In the left breast, no findings suspicious for
malignancy. Images were processed with CAD.
IMPRESSION: Further evaluation is suggested for calcifications in the right
breast.

RECOMMENDATION:
Diagnostic mammogram of the right breast. (Code:[id number])

The patient will be contacted regarding the findings, and additional
imaging will be scheduled.

BI-RADS CATEGORY  0: Incomplete. Need additional imaging evaluation
and/or prior mammograms for comparison.

## 2021-01-05 ENCOUNTER — Other Ambulatory Visit: Payer: Self-pay | Admitting: *Deleted

## 2021-01-05 DIAGNOSIS — G4700 Insomnia, unspecified: Secondary | ICD-10-CM

## 2021-01-05 MED ORDER — ZOLPIDEM TARTRATE 5 MG PO TABS: ORAL_TABLET | ORAL | 1 refills | Status: DC

## 2021-01-05 NOTE — Telephone Encounter (Signed)
Received fax requesting refill on Ambien.  Ok to refill??  Last office visit 09/04/2020.  Last refill 10/16/2020, #1 refill.

## 2021-03-08 ENCOUNTER — Other Ambulatory Visit: Payer: Self-pay | Admitting: Family Medicine

## 2021-03-08 DIAGNOSIS — G4700 Insomnia, unspecified: Secondary | ICD-10-CM

## 2021-04-11 ENCOUNTER — Encounter: Payer: Self-pay | Admitting: Gastroenterology

## 2021-05-14 ENCOUNTER — Other Ambulatory Visit: Payer: Self-pay | Admitting: Family Medicine

## 2021-05-14 DIAGNOSIS — G4700 Insomnia, unspecified: Secondary | ICD-10-CM

## 2021-05-15 ENCOUNTER — Other Ambulatory Visit: Payer: Self-pay | Admitting: Family Medicine

## 2021-05-15 DIAGNOSIS — G4700 Insomnia, unspecified: Secondary | ICD-10-CM

## 2021-05-15 MED ORDER — ZOLPIDEM TARTRATE 5 MG PO TABS: ORAL_TABLET | ORAL | 1 refills | Status: DC

## 2021-05-15 NOTE — Telephone Encounter (Signed)
Ok to refill??  Last office visit 09/04/2020.  Last refill 03/08/2021, #1 refill.

## 2021-05-15 NOTE — Telephone Encounter (Signed)
Patient called to request courtesy refill of zolpidem (AMBIEN) 5 MG tablet [[id number]]   Patient out of medication (last dose taken 10/1) and has difficulty sleeping without it. Patient requesting enough medication to last until office visit on 10/7.  Patient has a new insurance and [facility] is out of network. Moving forward, patient requesting all prescriptions be sent to [facility] on [address] in [place], phone number [phone].  Please advise at [phone].

## 2021-05-18 ENCOUNTER — Other Ambulatory Visit (HOSPITAL_COMMUNITY): Payer: Self-pay | Admitting: Family Medicine

## 2021-05-18 ENCOUNTER — Other Ambulatory Visit: Payer: Self-pay

## 2021-05-18 ENCOUNTER — Ambulatory Visit (INDEPENDENT_AMBULATORY_CARE_PROVIDER_SITE_OTHER): Payer: No Typology Code available for payment source | Admitting: Family Medicine

## 2021-05-18 ENCOUNTER — Encounter: Payer: Self-pay | Admitting: Family Medicine

## 2021-05-18 VITALS — BP 132/64 | HR 60 | Temp 97.9°F | Resp 16 | Ht 65.0 in | Wt 174.0 lb

## 2021-05-18 DIAGNOSIS — G4700 Insomnia, unspecified: Secondary | ICD-10-CM

## 2021-05-18 DIAGNOSIS — E785 Hyperlipidemia, unspecified: Secondary | ICD-10-CM | POA: Diagnosis not present

## 2021-05-18 DIAGNOSIS — Z23 Encounter for immunization: Secondary | ICD-10-CM

## 2021-05-18 DIAGNOSIS — Z1231 Encounter for screening mammogram for malignant neoplasm of breast: Secondary | ICD-10-CM

## 2021-05-18 DIAGNOSIS — K5900 Constipation, unspecified: Secondary | ICD-10-CM | POA: Diagnosis not present

## 2021-05-18 LAB — CBC WITH DIFFERENTIAL/PLATELET
Absolute Monocytes: 515 cells/uL (ref 200–950)
Basophils Absolute: 31 cells/uL (ref 0–200)
Basophils Relative: 0.5 %
Eosinophils Absolute: 68 cells/uL (ref 15–500)
Eosinophils Relative: 1.1 %
HCT: 41 % (ref 35.0–45.0)
Hemoglobin: 13.5 g/dL (ref 11.7–15.5)
Lymphs Abs: 2034 cells/uL (ref 850–3900)
MCH: 28.7 pg (ref 27.0–33.0)
MCHC: 32.9 g/dL (ref 32.0–36.0)
MCV: 87.2 fL (ref 80.0–100.0)
MPV: 9.6 fL (ref 7.5–12.5)
Monocytes Relative: 8.3 %
Neutro Abs: 3553 cells/uL (ref 1500–7800)
Neutrophils Relative %: 57.3 %
Platelets: 308 10*3/uL (ref 140–400)
RBC: 4.7 10*6/uL (ref 3.80–5.10)
RDW: 12 % (ref 11.0–15.0)
Total Lymphocyte: 32.8 %
WBC: 6.2 10*3/uL (ref 3.8–10.8)

## 2021-05-18 LAB — COMPLETE METABOLIC PANEL WITH GFR
AG Ratio: 1.6 (calc) (ref 1.0–2.5)
ALT: 15 U/L (ref 6–29)
AST: 18 U/L (ref 10–35)
Albumin: 4.2 g/dL (ref 3.6–5.1)
Alkaline phosphatase (APISO): 69 U/L (ref 37–153)
BUN: 25 mg/dL (ref 7–25)
CO2: 29 mmol/L (ref 20–32)
Calcium: 9.9 mg/dL (ref 8.6–10.4)
Chloride: 101 mmol/L (ref 98–110)
Creat: 1.01 mg/dL (ref 0.50–1.03)
Globulin: 2.6 g/dL (calc) (ref 1.9–3.7)
Glucose, Bld: 91 mg/dL (ref 65–99)
Potassium: 4 mmol/L (ref 3.5–5.3)
Sodium: 138 mmol/L (ref 135–146)
Total Bilirubin: 0.3 mg/dL (ref 0.2–1.2)
Total Protein: 6.8 g/dL (ref 6.1–8.1)
eGFR: 65 mL/min/{1.73_m2} (ref >=60)

## 2021-05-18 LAB — LIPID PANEL
Cholesterol: 244 mg/dL — ABNORMAL HIGH (ref <200)
HDL: 70 mg/dL (ref >=50)
LDL Cholesterol (Calc): 158 mg/dL (calc) — ABNORMAL HIGH
Non-HDL Cholesterol (Calc): 174 mg/dL (calc) — ABNORMAL HIGH (ref <130)
Total CHOL/HDL Ratio: 3.5 (calc) (ref <5.0)
Triglycerides: 68 mg/dL (ref <150)

## 2021-05-18 MED ORDER — TRAZODONE HCL 50 MG PO TABS: 50.0000 mg | ORAL_TABLET | Freq: Every evening | ORAL | 3 refills | Status: DC | PRN

## 2021-05-18 MED ORDER — ZOLPIDEM TARTRATE 5 MG PO TABS: ORAL_TABLET | ORAL | 1 refills | Status: DC

## 2021-05-18 MED ORDER — LINACLOTIDE 145 MCG PO CAPS: ORAL_CAPSULE | ORAL | 1 refills | Status: DC

## 2021-05-18 MED ORDER — ATORVASTATIN CALCIUM 10 MG PO TABS: 10.0000 mg | ORAL_TABLET | Freq: Every day | ORAL | 1 refills | Status: DC

## 2021-05-18 NOTE — Progress Notes (Signed)
Subjective:    Patient ID: Barbara Bradford, female    DOB: July 20, 1961, 60 y.o.   MRN: [id number]  HPI Patient is a very pleasant 60 year old Caucasian female who is here today for a follow-up of her chronic medical problems.  She has a history of diverticulosis and if she does not have regular bowel movements, she develops intense abdominal pain.  She has been out of the [place] for a little while due to insurance coverage issue.  She denies any blood in her stool.  She does have some mild soreness in her lower abdomen today.  She has an appointment scheduled to see her gastroenterologist in October but is requesting that I refill the Linzess at the present time.  She also has chronic insomnia.  She has been on Ambien now for about 2 or 3 years.  She states that she is become somewhat tolerant to the medication.  The medicine helps her fall asleep but she typically wakes up 2 or 3 hours afterwards and has a difficult time going back to sleep.  She states that she can only get about 4 to 5 hours of sleep at night.  She denies any issues with anxiety or depression.  She is due for her flu shot.  She is also due for lab work to monitor her cholesterol.  She denies any myalgias or right upper quadrant pain. Past Medical History:  Diagnosis Date   Abdominal bloating    Diverticulosis    history of   Gas    at times   Hx of abnormal cervical Pap smear    Osteopenia    Past Surgical History:  Procedure Laterality Date   ABDOMINAL HYSTERECTOMY     has part of one ovary   abdominaplasty  2000   AUGMENTATION MAMMAPLASTY Bilateral    implants removed a year ago   [place]     left side/had pins removed after a fracture   hx coloposcopy     REDUCTION MAMMAPLASTY Bilateral    Current Outpatient Medications on File Prior to Visit  Medication Sig Dispense Refill   meloxicam (MOBIC) 15 MG tablet TAKE 1 TABLET BY MOUTH EVERY DAY (Patient taking differently:  daily as needed.) 30 tablet 0   No current facility-administered medications on file prior to visit.   No Known Allergies Social History   Socioeconomic History   Marital status: Married    Spouse name: Not on file   Number of children: 2   Years of education: Not on file   Highest education level: Not on file  Occupational History   Not on file  Tobacco Use   Smoking status: Former    Types: Cigarettes    Quit date: 04/16/1997    Years since quitting: 24.1   Smokeless tobacco: Never  Vaping Use   Vaping Use: Never used  Substance and Sexual Activity   Alcohol use: Yes    Alcohol/week: 2.0 standard drinks    Types: 2 Cans of beer per week   Drug use: No   Sexual activity: Yes    Partners: Male    Birth control/protection: None, Post-menopausal  Other Topics Concern   Not on file  Social History Narrative   Not on file   Social Determinants of Health   Financial Resource Strain: Not on file  Food Insecurity: Not on file  Transportation Needs: Not on file  Physical Activity: Not on file  Stress: Not on  file  Social Connections: Not on file  Intimate Partner Violence: Not on file      Review of Systems  All other systems reviewed and are negative.     Objective:   Physical Exam Vitals reviewed.  Constitutional:      Appearance: Normal appearance.  Cardiovascular:     Rate and Rhythm: Normal rate and regular rhythm.     Heart sounds: Normal heart sounds. No murmur heard. Pulmonary:     Effort: Pulmonary effort is normal.     Breath sounds: Normal breath sounds. No wheezing, rhonchi or rales.  Neurological:     Mental Status: She is alert.          Assessment & Plan:  Insomnia, unspecified type - sleep hygeine reviewed and advised, keep journal, other stress coping mechanisms, trial of low dose ambien - Plan: zolpidem (AMBIEN) 5 MG tablet  Constipation, unspecified constipation type - improved with linzess 145 mcg, con't, follow up as needed -  needed refill - Plan: linaclotide (LINZESS) 145 MCG CAPS capsule  Hyperlipidemia, unspecified hyperlipidemia type - Plan: atorvastatin (LIPITOR) 10 MG tablet, CBC with Differential/Platelet, COMPLETE METABOLIC PANEL WITH GFR, Lipid panel Check CBC CMP and fasting lipid panel.  I recommended that we not increase Ambien to avoid further habituation and dependency.  Instead we will try trazodone 50 mg p.o. nightly for insomnia.  May increase to 100 mg p.o. nightly if necessary.  I will gladly refill her Linzess.  Patient also received her flu shot.

## 2021-05-28 ENCOUNTER — Ambulatory Visit (HOSPITAL_COMMUNITY): Payer: 59

## 2021-06-01 ENCOUNTER — Ambulatory Visit: Payer: No Typology Code available for payment source | Admitting: Family Medicine

## 2021-06-05 ENCOUNTER — Ambulatory Visit: Payer: 59 | Admitting: Gastroenterology

## 2021-06-25 ENCOUNTER — Inpatient Hospital Stay (HOSPITAL_COMMUNITY): Admission: RE | Admit: 2021-06-25 | Payer: 59 | Source: Ambulatory Visit

## 2021-10-15 ENCOUNTER — Other Ambulatory Visit: Payer: Self-pay | Admitting: Family Medicine

## 2021-10-19 ENCOUNTER — Ambulatory Visit: Payer: No Typology Code available for payment source | Admitting: Family Medicine

## 2021-11-06 ENCOUNTER — Encounter: Payer: Self-pay | Admitting: Orthopaedic Surgery

## 2021-11-06 ENCOUNTER — Other Ambulatory Visit: Payer: Self-pay

## 2021-11-06 ENCOUNTER — Ambulatory Visit (INDEPENDENT_AMBULATORY_CARE_PROVIDER_SITE_OTHER): Payer: No Typology Code available for payment source | Admitting: Orthopaedic Surgery

## 2021-11-06 ENCOUNTER — Ambulatory Visit (INDEPENDENT_AMBULATORY_CARE_PROVIDER_SITE_OTHER): Payer: 59

## 2021-11-06 DIAGNOSIS — M79604 Pain in right leg: Secondary | ICD-10-CM

## 2021-11-06 DIAGNOSIS — M7062 Trochanteric bursitis, left hip: Secondary | ICD-10-CM | POA: Diagnosis not present

## 2021-11-06 DIAGNOSIS — M7061 Trochanteric bursitis, right hip: Secondary | ICD-10-CM

## 2021-11-06 DIAGNOSIS — M79605 Pain in left leg: Secondary | ICD-10-CM | POA: Diagnosis not present

## 2021-11-06 MED ORDER — METHYLPREDNISOLONE ACETATE 40 MG/ML IJ SUSP
40.0000 mg | INTRAMUSCULAR | Status: AC | PRN
  Administered 2021-11-06: 40 mg via INTRA_ARTICULAR

## 2021-11-06 MED ORDER — LIDOCAINE HCL 1 % IJ SOLN
3.0000 mL | INTRAMUSCULAR | Status: AC | PRN
  Administered 2021-11-06: 3 mL

## 2021-11-06 NOTE — Progress Notes (Signed)
? ?Office Visit Note ?  ?Patient: Barbara Bradford           ?Date of Birth: June 20, 1961           ?MRN: [id number] ?Visit Date: 11/06/2021 ?             ?Requested by: Susy Frizzle, MD ?[address] [place],  [place] [address] ?PCP: Susy Frizzle, MD ? ? ?Assessment & Plan: ?Visit Diagnoses:  ?1. Bilateral leg pain   ?2. Trochanteric bursitis, right hip   ?3. Trochanteric bursitis, left hip   ? ? ?Plan:   I did recommend a steroid injection over the bursa area on the right hip since a small painful side and she agreed to this and tolerated it well.  She is a perfect candidate for outpatient physical therapy up in [place] for any modalities that can help decrease the pain over the trochanteric area on both sides.  I will see her back in 6 weeks after course of therapy.  All questions and concerns were answered and addressed. ? ?Follow-Up Instructions: Return in about 6 weeks (around 12/18/2021).  ? ?Orders:  ?Orders Placed This Encounter  ?Procedures  ? Large Joint Inj  ? XR Lumbar Spine 2-3 Views  ? ?No orders of the defined types were placed in this encounter. ? ? ? ? Procedures: ?Large Joint Inj: R greater trochanter on 11/06/2021 9:40 AM ?Indications: pain and diagnostic evaluation ?Details: 22 G 1.5 in needle, lateral approach ? ?Arthrogram: No ? ?Medications: 3 mL lidocaine 1 %; 40 mg methylPREDNISolone acetate 40 MG/ML ?Outcome: tolerated well, no immediate complications ?Procedure, treatment alternatives, risks and benefits explained, specific risks discussed. Consent was given by the patient. Immediately prior to procedure a time out was called to verify the correct patient, procedure, equipment, support staff and site/side marked as required. Patient was prepped and draped in the usual sterile fashion.  ? ? ? ? ?Clinical Data: ?No additional findings. ? ? ?Subjective: ?Chief Complaint  ?Patient presents with  ? Right Leg - Pain  ? Left Leg - Pain  ?The patient comes in for evaluation  treatment of bilateral hip pain and sciatica.  However she points to the lateral aspect of both her hips as a source of her pain with no pain in the groin.  We actually saw her in 2020 and she had normal x-rays of her hips then and eventually MRI of her hips were obtained and these were normal.  She is active.  She is 61 years old.  She does do a lot of exercise walking.  She hurts to sleep on either hip at night that does wake her up.  She is not a diabetic.  She does take Advil and Aleve and this helps some.  She has not been through any type of physical therapy. ? ?HPI ? ?Review of Systems ?There is no fever, chills, nausea, vomiting ? ?Objective: ?Vital Signs: There were no vitals taken for this visit. ? ?Physical Exam ?She is alert and orient x3 and in no acute distress ?Ortho Exam ?Examination of both hips show the move smoothly and fluidly.  There is pain to palpation of the trochanteric area on both sides with the right worse than left.  There is proximal IT band pain as well.  She has a negative straight leg raise bilaterally and normal motion of both hips with no pain in the groin.  She has 5 out of 5 strength of both her  lower extremities and normal sensation. ?Specialty Comments:  ?No specialty comments available. ? ?Imaging: ?XR Lumbar Spine 2-3 Views ? ?Result Date: 11/06/2021 ?2 views of the lumbar spine show no acute findings.  The alignment is well-maintained.  The top of the hips can be seen on both sides and they appear normal.  ? ? ?PMFS History: ?Patient Active Problem List  ? Diagnosis Date Noted  ? Osteopenia   ? Diverticulosis 06/27/2018  ? Anxiety and depression 06/27/2018  ? Other abnormal glucose 06/27/2018  ? Hyperlipemia 04/21/2018  ? Overweight (BMI 25.0-29.9) 04/16/2018  ? Insomnia 04/16/2018  ? Constipation 07/10/2010  ? ?Past Medical History:  ?Diagnosis Date  ? Abdominal bloating   ? Diverticulosis   ? history of  ? Gas   ? at times  ? Hx of abnormal cervical Pap smear   ? Osteopenia    ?  ?Family History  ?Problem Relation Age of Onset  ? Dementia Mother   ? Diabetes Mother   ? Stroke Father   ? Pancreatic cancer Sister   ?  ?Past Surgical History:  ?Procedure Laterality Date  ? ABDOMINAL HYSTERECTOMY    ? has part of one ovary  ? abdominaplasty  2000  ? AUGMENTATION MAMMAPLASTY Bilateral   ? implants removed a year ago  ? BREAST ENHANCEMENT SURGERY    ? CLAVICLE SURGERY    ? left side/had pins removed after a fracture  ? hx coloposcopy    ? REDUCTION MAMMAPLASTY Bilateral   ? ?Social History  ? ?Occupational History  ? Not on file  ?Tobacco Use  ? Smoking status: Former  ?  Types: Cigarettes  ?  Quit date: 04/16/1997  ?  Years since quitting: 24.5  ? Smokeless tobacco: Never  ?Vaping Use  ? Vaping Use: Never used  ?Substance and Sexual Activity  ? Alcohol use: Yes  ?  Alcohol/week: 2.0 standard drinks  ?  Types: 2 Cans of beer per week  ? Drug use: No  ? Sexual activity: Yes  ?  Partners: Male  ?  Birth control/protection: None, Post-menopausal  ? ? ? ? ? ? ?

## 2021-11-14 ENCOUNTER — Other Ambulatory Visit: Payer: Self-pay | Admitting: Family Medicine

## 2021-11-14 DIAGNOSIS — E785 Hyperlipidemia, unspecified: Secondary | ICD-10-CM

## 2021-11-14 DIAGNOSIS — K5900 Constipation, unspecified: Secondary | ICD-10-CM

## 2021-11-15 ENCOUNTER — Other Ambulatory Visit: Payer: Self-pay

## 2021-11-22 ENCOUNTER — Other Ambulatory Visit: Payer: Self-pay | Admitting: Family Medicine

## 2021-11-22 DIAGNOSIS — G4700 Insomnia, unspecified: Secondary | ICD-10-CM

## 2021-11-22 NOTE — Telephone Encounter (Signed)
LOV 05/18/21 ?Last refill 05/18/21, #90, 1 refills ? ?Please review, thanks! ? ?

## 2021-12-18 ENCOUNTER — Ambulatory Visit: Payer: No Typology Code available for payment source | Admitting: Orthopaedic Surgery

## 2021-12-18 ENCOUNTER — Ambulatory Visit (INDEPENDENT_AMBULATORY_CARE_PROVIDER_SITE_OTHER): Payer: No Typology Code available for payment source | Admitting: Family Medicine

## 2021-12-18 VITALS — BP 124/84 | HR 58 | Temp 97.7°F | Ht 65.0 in | Wt 164.4 lb

## 2021-12-18 DIAGNOSIS — H7291 Unspecified perforation of tympanic membrane, right ear: Secondary | ICD-10-CM
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
## 2022-01-15 ENCOUNTER — Ambulatory Visit: Payer: No Typology Code available for payment source | Admitting: Family Medicine

## 2022-03-18 ENCOUNTER — Other Ambulatory Visit: Payer: Self-pay | Admitting: Family Medicine

## 2022-03-18 ENCOUNTER — Telehealth: Payer: Self-pay

## 2022-03-18 ENCOUNTER — Encounter: Payer: Self-pay | Admitting: Family Medicine

## 2022-03-18 ENCOUNTER — Emergency Department (HOSPITAL_BASED_OUTPATIENT_CLINIC_OR_DEPARTMENT_OTHER): Payer: No Typology Code available for payment source

## 2022-03-18 ENCOUNTER — Encounter (HOSPITAL_BASED_OUTPATIENT_CLINIC_OR_DEPARTMENT_OTHER): Payer: Self-pay | Admitting: Pharmacy Technician

## 2022-03-18 ENCOUNTER — Other Ambulatory Visit: Payer: Self-pay

## 2022-03-18 ENCOUNTER — Emergency Department (HOSPITAL_BASED_OUTPATIENT_CLINIC_OR_DEPARTMENT_OTHER)
Admission: EM | Admit: 2022-03-18 | Discharge: 2022-03-18 | Disposition: A | Discharge status: Home / Self Care | Payer: No Typology Code available for payment source | Attending: Emergency Medicine | Admitting: Emergency Medicine

## 2022-03-18 DIAGNOSIS — R109 Unspecified abdominal pain: Secondary | ICD-10-CM

## 2022-03-18 DIAGNOSIS — N6310 Unspecified lump in the right breast, unspecified quadrant: Secondary | ICD-10-CM

## 2022-03-18 DIAGNOSIS — R1031 Right lower quadrant pain: Secondary | ICD-10-CM | POA: Insufficient documentation

## 2022-03-18 DIAGNOSIS — Z90710 Acquired absence of both cervix and uterus: Secondary | ICD-10-CM | POA: Insufficient documentation

## 2022-03-18 LAB — COMPREHENSIVE METABOLIC PANEL
ALT: 17 U/L (ref 0–44)
AST: 19 U/L (ref 15–41)
Albumin: 4.5 g/dL (ref 3.5–5.0)
Alkaline Phosphatase: 60 U/L (ref 38–126)
Anion gap: 10 (ref 5–15)
BUN: 17 mg/dL (ref 6–20)
CO2: 29 mmol/L (ref 22–32)
Calcium: 10 mg/dL (ref 8.9–10.3)
Chloride: 100 mmol/L (ref 98–111)
Creatinine, Ser: 0.86 mg/dL (ref 0.44–1.00)
GFR, Estimated: >60 mL/min (ref >60)
Glucose, Bld: 93 mg/dL (ref 70–99)
Potassium: 3.9 mmol/L (ref 3.5–5.1)
Sodium: 139 mmol/L (ref 135–145)
Total Bilirubin: 0.6 mg/dL (ref 0.3–1.2)
Total Protein: 7.8 g/dL (ref 6.5–8.1)

## 2022-03-18 LAB — URINALYSIS, ROUTINE W REFLEX MICROSCOPIC
Bilirubin Urine: NEGATIVE
Glucose, UA: NEGATIVE mg/dL
Hgb urine dipstick: NEGATIVE
Ketones, ur: NEGATIVE mg/dL
Leukocytes,Ua: NEGATIVE
Nitrite: NEGATIVE
Protein, ur: NEGATIVE mg/dL
Specific Gravity, Urine: 1.005 (ref 1.005–1.030)
pH: 7 (ref 5.0–8.0)

## 2022-03-18 LAB — CBC WITH DIFFERENTIAL/PLATELET
Abs Immature Granulocytes: 0.01 10*3/uL (ref 0.00–0.07)
Basophils Absolute: 0 10*3/uL (ref 0.0–0.1)
Basophils Relative: 0 %
Eosinophils Absolute: 0.1 10*3/uL (ref 0.0–0.5)
Eosinophils Relative: 1 %
HCT: 43.6 % (ref 36.0–46.0)
Hemoglobin: 14.4 g/dL (ref 12.0–15.0)
Immature Granulocytes: 0 %
Lymphocytes Relative: 27 %
Lymphs Abs: 1.4 10*3/uL (ref 0.7–4.0)
MCH: 29.3 pg (ref 26.0–34.0)
MCHC: 33 g/dL (ref 30.0–36.0)
MCV: 88.8 fL (ref 80.0–100.0)
Monocytes Absolute: 0.5 10*3/uL (ref 0.1–1.0)
Monocytes Relative: 8 %
Neutro Abs: 3.4 10*3/uL (ref 1.7–7.7)
Neutrophils Relative %: 64 %
Platelets: 191 10*3/uL (ref 150–400)
RBC: 4.91 MIL/uL (ref 3.87–5.11)
RDW: 13.3 % (ref 11.5–15.5)
WBC: 5.4 10*3/uL (ref 4.0–10.5)
nRBC: 0 % (ref 0.0–0.2)

## 2022-03-18 LAB — LIPASE, BLOOD: Lipase: 25 U/L (ref 11–51)

## 2022-03-18 MED ORDER — ONDANSETRON HCL 4 MG/2ML IJ SOLN
4.0000 mg | Freq: Once | INTRAMUSCULAR | Status: AC
  Administered 2022-03-18: 4 mg via INTRAVENOUS
  Filled 2022-03-18: qty 2

## 2022-03-18 MED ORDER — ACETAMINOPHEN 500 MG PO TABS
1000.0000 mg | ORAL_TABLET | Freq: Once | ORAL | Status: AC
  Administered 2022-03-18: 1000 mg via ORAL
  Filled 2022-03-18: qty 2

## 2022-03-18 MED ORDER — IOHEXOL 300 MG/ML  SOLN
100.0000 mL | Freq: Once | INTRAMUSCULAR | Status: AC | PRN
  Administered 2022-03-18: 80 mL via INTRAVENOUS

## 2022-03-18 MED ORDER — LACTATED RINGERS IV BOLUS
1000.0000 mL | Freq: Once | INTRAVENOUS | Status: AC
  Administered 2022-03-18: 1000 mL via INTRAVENOUS

## 2022-03-18 MED ORDER — MELOXICAM 15 MG PO TABS: 15.0000 mg | ORAL_TABLET | Freq: Every day | ORAL | 0 refills | Status: AC

## 2022-03-18 MED ORDER — KETOROLAC TROMETHAMINE 15 MG/ML IJ SOLN
15.0000 mg | Freq: Once | INTRAMUSCULAR | Status: AC
  Administered 2022-03-18: 15 mg via INTRAVENOUS
  Filled 2022-03-18: qty 1

## 2022-03-18 MED ORDER — MORPHINE SULFATE (PF) 4 MG/ML IV SOLN
4.0000 mg | Freq: Once | INTRAVENOUS | Status: AC
  Administered 2022-03-18: 4 mg via INTRAVENOUS
  Filled 2022-03-18: qty 1

## 2022-03-18 NOTE — Discharge Instructions (Addendum)
You were seen today for right sided flank and front pain. We did not identify any emergent cause for your symptoms. 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  Additionally, we identified a new mass in your right breast as we discussed.  It is critical that you follow-up with your primary care provider within 72 hours to initiate further work-up on this breast mass.  We also identified fluid inside of your right lung that does not appear to be infectious at this time.  Continue to evaluate with your primary care provider.  Plan and next steps:   The following may be helpful in managing your symptoms:   Pain- Lidocaine Patches  Apply to affected area for up to 12 hours at a time.   Pain/Fever- Adult Tylenol dosing:  650 mg orally every 4 to 6 hours as needed, MAX: 3250 mg/24 hours   (Extra-strength) 1000 mg orally every 6 hours as needed; MAX: 3000 mg/24 hours   Do not use if you have liver disease. Read the label on the bottle.   Pain/Fever- I have prescribed MOBIC/Meloxicam -Daily as needed  Gastritis/Heartburn- Omeprazole Take '20mg'$  Daily for 14 days You may need this medication to deal with the effect of NSAIDS  Findings:  You may see all of your lab and imaging results utilizing our online portal! Look in this document or ask a team member for your mychart* access information. The most notable results have additionally been verbally communicated with you and your bedside family.    Follow-up Plan:   Follow up with the patient's normal primary care provider for monitoring of this condition within 48 hours.   Signs/Symptoms that would warrant return to the ED:  Please return to the ED if you experience worsening of symptoms or any abrupt changes in your health. Standard of care precautions for your chief complaint have already been verbally communicated with you. Always be on alert for fevers, chills, shortness of breath, chest pains, or sudden changes that warrant  immediate evaluation.    Thank you for allowing [place] to be a part of you and your families' care.   Tretha Sciara MD

## 2022-03-18 NOTE — ED Notes (Signed)
Patient Alert and oriented to baseline. Stable and ambulatory to baseline. Patient verbalized understanding of the discharge instructions.  Patient belongings were taken by the patient.   

## 2022-03-18 NOTE — Telephone Encounter (Signed)
Called pt, aware mammo has been ordered per Dr. Dennard Schaumann.

## 2022-03-18 NOTE — ED Provider Notes (Signed)
[facility] EMERGENCY DEPT Provider Note   CSN: [id number] Arrival date & time: 03/18/22  7902     History Chief Complaint  Patient presents with   Flank Pain    HPI Barbara Bradford is a 61 y.o. female presenting for flank pain.  Patient states that she started having right-sided flank pain approximately 72 hours prior to arrival.  It was mild in nature initially and began radiating to her right lower quadrant into her right groin.  She denies pelvic pain at this time, vaginal discharge, vaginal bleeding.  Patient is status post hysterectomy.  Patient has minimal other abdominal surgical history. Patient denies fevers chills, nausea vomiting, syncope or shortness of breath.  Her pain is intermittent in nature and peaks at a 10 out of 10.  She has no history of similar. She has a family history of nephrolithiasis. Patient has taken no medications prior to arrival.   Patient's recorded medical, surgical, social, medication list and allergies were reviewed in the Snapshot window as part of the initial history.   Review of Systems   Review of Systems  Constitutional:  Negative for chills and fever.  HENT:  Negative for ear pain and sore throat.   Eyes:  Negative for pain and visual disturbance.  Respiratory:  Negative for cough and shortness of breath.   Cardiovascular:  Negative for chest pain and palpitations.  Gastrointestinal:  Negative for abdominal pain and vomiting.  Genitourinary:  Positive for flank pain. Negative for dysuria and hematuria.  Musculoskeletal:  Negative for arthralgias and back pain.  Skin:  Negative for color change and rash.  Neurological:  Negative for seizures and syncope.  All other systems reviewed and are negative.   Physical Exam Updated Vital Signs BP 133/79 (BP Location: Left Arm)   Pulse 66   Temp 97.8 F (36.6 C)   Resp 17   SpO2 100%  Physical Exam Vitals and nursing note reviewed.  Constitutional:      General: She is  not in acute distress.    Appearance: She is well-developed.  HENT:     Head: Normocephalic and atraumatic.  Eyes:     Conjunctiva/sclera: Conjunctivae normal.  Cardiovascular:     Rate and Rhythm: Normal rate and regular rhythm.     Heart sounds: No murmur heard. Pulmonary:     Effort: Pulmonary effort is normal. No respiratory distress.     Breath sounds: Normal breath sounds.  Abdominal:     General: There is no distension.     Palpations: Abdomen is soft.     Tenderness: There is abdominal tenderness (RLQ). There is no right CVA tenderness, left CVA tenderness or guarding.  Musculoskeletal:        General: No swelling or tenderness. Normal range of motion.     Cervical back: Neck supple.  Skin:    General: Skin is warm and dry.  Neurological:     General: No focal deficit present.     Mental Status: She is alert and oriented to person, place, and time. Mental status is at baseline.     Cranial Nerves: No cranial nerve deficit.      ED Course/ Medical Decision Making/ A&P    Procedures Procedures   Medications Ordered in ED Medications  lactated ringers bolus 1,000 mL (1,000 mLs Intravenous New Bag/Given 03/18/22 0906)  morphine (PF) 4 MG/ML injection 4 mg (4 mg Intravenous Given 03/18/22 0907)  ondansetron (ZOFRAN) injection 4 mg (4 mg Intravenous Given 03/18/22 0907)  iohexol (OMNIPAQUE) 300 MG/ML solution 100 mL (80 mLs Intravenous Contrast Given 03/18/22 0946)  ketorolac (TORADOL) 15 MG/ML injection 15 mg (15 mg Intravenous Given 03/18/22 1030)  acetaminophen (TYLENOL) tablet 1,000 mg (1,000 mg Oral Given 03/18/22 1029)    Medical Decision Making:    Barbara Bradford is a 61 y.o. female who presented to the ED today with abdominal pain, detailed above.    Additional history discussed with patient's family/caregivers.  Patient's presentation is complicated by their history of HLD on chronic OP therapy, history of intraabdominal surgery.  Patient placed on continuous  vitals and telemetry monitoring while in ED which was reviewed periodically.  Complete initial physical exam performed, notably the patient  was HDS in NAD.  She is otherwise able to tolerate p.o. intake.  She had a paroxysm of pain causing severe right sided abdominal pain.     Reviewed and confirmed nursing documentation for past medical history, family history, social history.    Initial Assessment:   With the patient's presentation of lumbar pain, most likely diagnosis is nephrolithiasis versus recurrent diverticulitis versus referred lumbar pain. Other diagnoses were considered including (but not limited to) gastroenteritis, colitis, small bowel obstruction, appendicitis, cholecystitis, pancreatitis, nephrolithiasis, UTI, pyleonephritis, Ovarian torsion. These are considered less likely due to history of present illness and physical exam findings.   This is most consistent with an acute life/limb threatening illness complicated by underlying chronic conditions.   Initial Plan:   CBC/CMP to evaluate for underlying infectious/metabolic etiology for patient's abdominal pain  Lipase to evaluate for pancreatitis  EKG to evaluate for cardiac source of pain  CTAB/Pelvis with contrast to evaluate for structural/surgical etiology of patients' severe abdominal pain.  Urinalysis and repeat physical assessment to evaluate for UTI/Pyelonpehritis  Empiric management of symptoms with escalating pain control and antiemetics as needed.   Initial Study Results:    Laboratory  All laboratory results reviewed without evidence of clinically relevant pathology.   EKG EKG was reviewed independently. Rate, rhythm, axis, intervals all examined and without medically relevant abnormality. ST segments without concerns for elevations.    Radiology All images reviewed independently. Agree with radiology report at this time.    CT ABDOMEN PELVIS W CONTRAST  Final Result      Final Reassessment and Plan:   On  repeat evaluation, patient is mildly symptomatically improved following administration of pain medications IV.  She is currently able to ambulate though still has pain in a sitting position.  She has no CVA tenderness or abdominal guarding on repeat evaluation. Had a prolonged conversation with the patient.  Notably, informed her of her breast mass and importance of close outpatient follow-up.  This was added to her problem list and her discharge instructions. Additionally, we discussed her presenting symptoms today.  Uncertain clinical etiology.  She does endorse increased muscle skeletal strain secondary to take care of her demented mother.  No focal pathology appreciated on labs or CT imaging.  Do not favor ovarian torsion due to lack of ovarian cyst and history of hysterectomy.  Patient is in no acute distress at this time otherwise. Hemodynamically stable.  Will start patient on anti-inflammatories and recommend strict return precautions regarding clinical deterioration or worsening of symptoms. Disposition:  Based on the above findings, I believe patient is stable for discharge.    Patient/family educated about specific return precautions for given chief complaint and symptoms.  Patient/family educated about follow-up with PCP.     Patient/family expressed understanding of return precautions  and need for follow-up. Patient spoken to regarding all imaging and laboratory results and appropriate follow up for these results. All education provided in verbal form with additional information in written form. Time was allowed for answering of patient questions. Patient discharged.    Emergency Department Medication Summary:   Medications  lactated ringers bolus 1,000 mL (1,000 mLs Intravenous New Bag/Given 03/18/22 0906)  morphine (PF) 4 MG/ML injection 4 mg (4 mg Intravenous Given 03/18/22 0907)  ondansetron (ZOFRAN) injection 4 mg (4 mg Intravenous Given 03/18/22 0907)  iohexol (OMNIPAQUE) 300 MG/ML  solution 100 mL (80 mLs Intravenous Contrast Given 03/18/22 0946)  ketorolac (TORADOL) 15 MG/ML injection 15 mg (15 mg Intravenous Given 03/18/22 1030)  acetaminophen (TYLENOL) tablet 1,000 mg (1,000 mg Oral Given 03/18/22 1029)      Clinical Impression:  1. Flank pain   2. Mass of right breast, unspecified quadrant       Discharge   Final Clinical Impression(s) / ED Diagnoses Final diagnoses:  Flank pain  Mass of right breast, unspecified quadrant    Rx / DC Orders ED Discharge Orders          Ordered    meloxicam (MOBIC) 15 MG tablet  Daily        03/18/22 1052              Tretha Sciara, MD 03/18/22 1110

## 2022-03-18 NOTE — Telephone Encounter (Signed)
Pt called today stating that she had a CT scan done. During this visit, they also found that she had a mass on her breast.   Pt would like an order for a mammogram done?

## 2022-03-18 NOTE — ED Triage Notes (Signed)
Pt here with reports of R sided back pain onset Friday. Reports pain has not shifted to R lower abdomen. Denies urinary symptoms.

## 2022-03-19 ENCOUNTER — Other Ambulatory Visit (HOSPITAL_COMMUNITY): Payer: Self-pay | Admitting: Family Medicine

## 2022-03-19 DIAGNOSIS — N6310 Unspecified lump in the right breast, unspecified quadrant: Secondary | ICD-10-CM

## 2022-04-01 ENCOUNTER — Ambulatory Visit (HOSPITAL_COMMUNITY)
Admission: RE | Admit: 2022-04-01 | Discharge: 2022-04-01 | Disposition: A | Discharge status: Home / Self Care | Payer: No Typology Code available for payment source | Source: Ambulatory Visit | Attending: Family Medicine | Admitting: Family Medicine

## 2022-04-01 ENCOUNTER — Encounter (HOSPITAL_COMMUNITY): Payer: Self-pay

## 2022-04-01 DIAGNOSIS — N6310 Unspecified lump in the right breast, unspecified quadrant: Secondary | ICD-10-CM

## 2022-04-10 ENCOUNTER — Other Ambulatory Visit (HOSPITAL_COMMUNITY): Payer: No Typology Code available for payment source

## 2022-04-10 ENCOUNTER — Encounter (HOSPITAL_COMMUNITY): Payer: No Typology Code available for payment source

## 2022-05-02 ENCOUNTER — Ambulatory Visit (HOSPITAL_BASED_OUTPATIENT_CLINIC_OR_DEPARTMENT_OTHER): Payer: No Typology Code available for payment source | Admitting: Family Medicine

## 2022-06-04 ENCOUNTER — Other Ambulatory Visit: Payer: Self-pay | Admitting: Family Medicine

## 2022-06-04 DIAGNOSIS — E785 Hyperlipidemia, unspecified: Secondary | ICD-10-CM

## 2022-06-10 ENCOUNTER — Ambulatory Visit (HOSPITAL_BASED_OUTPATIENT_CLINIC_OR_DEPARTMENT_OTHER): Payer: No Typology Code available for payment source | Admitting: Family Medicine

## 2022-06-10 ENCOUNTER — Encounter (HOSPITAL_BASED_OUTPATIENT_CLINIC_OR_DEPARTMENT_OTHER): Payer: Self-pay

## 2022-06-25 ENCOUNTER — Ambulatory Visit (INDEPENDENT_AMBULATORY_CARE_PROVIDER_SITE_OTHER): Payer: No Typology Code available for payment source | Admitting: Gastroenterology

## 2022-06-25 ENCOUNTER — Encounter: Payer: Self-pay | Admitting: Gastroenterology

## 2022-06-25 ENCOUNTER — Ambulatory Visit (INDEPENDENT_AMBULATORY_CARE_PROVIDER_SITE_OTHER): Payer: No Typology Code available for payment source | Admitting: Family Medicine

## 2022-06-25 VITALS — BP 120/76 | HR 75 | Ht 65.0 in | Wt 164.4 lb

## 2022-06-25 VITALS — BP 140/92 | HR 71 | Ht 65.0 in | Wt 165.0 lb

## 2022-06-25 DIAGNOSIS — E785 Hyperlipidemia, unspecified: Secondary | ICD-10-CM | POA: Diagnosis not present

## 2022-06-25 DIAGNOSIS — G4700 Insomnia, unspecified: Secondary | ICD-10-CM

## 2022-06-25 DIAGNOSIS — Z23 Encounter for immunization: Secondary | ICD-10-CM | POA: Diagnosis not present

## 2022-06-25 DIAGNOSIS — R1032 Left lower quadrant pain: Secondary | ICD-10-CM

## 2022-06-25 DIAGNOSIS — F32A Depression, unspecified: Secondary | ICD-10-CM

## 2022-06-25 DIAGNOSIS — K5732 Diverticulitis of large intestine without perforation or abscess without bleeding: Secondary | ICD-10-CM

## 2022-06-25 DIAGNOSIS — F419 Anxiety disorder, unspecified: Secondary | ICD-10-CM

## 2022-06-25 LAB — CBC WITH DIFFERENTIAL/PLATELET
Absolute Monocytes: 360 cells/uL (ref 200–950)
Basophils Absolute: 29 cells/uL (ref 0–200)
Basophils Relative: 0.6 %
Eosinophils Absolute: 72 cells/uL (ref 15–500)
Eosinophils Relative: 1.5 %
HCT: 42.9 % (ref 35.0–45.0)
Hemoglobin: 14.3 g/dL (ref 11.7–15.5)
Lymphs Abs: 1190 cells/uL (ref 850–3900)
MCH: 29.3 pg (ref 27.0–33.0)
MCHC: 33.3 g/dL (ref 32.0–36.0)
MCV: 87.9 fL (ref 80.0–100.0)
MPV: 9.4 fL (ref 7.5–12.5)
Monocytes Relative: 7.5 %
Neutro Abs: 3149 cells/uL (ref 1500–7800)
Neutrophils Relative %: 65.6 %
Platelets: 283 10*3/uL (ref 140–400)
RBC: 4.88 10*6/uL (ref 3.80–5.10)
RDW: 11.9 % (ref 11.0–15.0)
Total Lymphocyte: 24.8 %
WBC: 4.8 10*3/uL (ref 3.8–10.8)

## 2022-06-25 LAB — LIPID PANEL
Cholesterol: 197 mg/dL (ref <200)
HDL: 83 mg/dL (ref >=50)
LDL Cholesterol (Calc): 100 mg/dL (calc) — ABNORMAL HIGH
Non-HDL Cholesterol (Calc): 114 mg/dL (calc) (ref <130)
Total CHOL/HDL Ratio: 2.4 (calc) (ref <5.0)
Triglycerides: 56 mg/dL (ref <150)

## 2022-06-25 LAB — COMPLETE METABOLIC PANEL WITH GFR
AG Ratio: 1.3 (calc) (ref 1.0–2.5)
ALT: 14 U/L (ref 6–29)
AST: 17 U/L (ref 10–35)
Albumin: 4.1 g/dL (ref 3.6–5.1)
Alkaline phosphatase (APISO): 74 U/L (ref 37–153)
BUN: 20 mg/dL (ref 7–25)
CO2: 29 mmol/L (ref 20–32)
Calcium: 9.4 mg/dL (ref 8.6–10.4)
Chloride: 102 mmol/L (ref 98–110)
Creat: 0.76 mg/dL (ref 0.50–1.03)
Globulin: 3.1 g/dL (calc) (ref 1.9–3.7)
Glucose, Bld: 90 mg/dL (ref 65–99)
Potassium: 4.3 mmol/L (ref 3.5–5.3)
Sodium: 139 mmol/L (ref 135–146)
Total Bilirubin: 0.4 mg/dL (ref 0.2–1.2)
Total Protein: 7.2 g/dL (ref 6.1–8.1)
eGFR: 90 mL/min/{1.73_m2} (ref >=60)

## 2022-06-25 MED ORDER — ESCITALOPRAM OXALATE 10 MG PO TABS: 10.0000 mg | ORAL_TABLET | Freq: Every day | ORAL | 3 refills | Status: DC

## 2022-06-25 MED ORDER — ATORVASTATIN CALCIUM 10 MG PO TABS
ORAL_TABLET | ORAL | 3 refills | Status: DC
Start: 2022-06-25 — End: 2023-06-11

## 2022-06-25 MED ORDER — ZOLPIDEM TARTRATE 5 MG PO TABS: 5.0000 mg | ORAL_TABLET | Freq: Every evening | ORAL | 3 refills | Status: DC | PRN

## 2022-06-25 MED ORDER — AMOXICILLIN-POT CLAVULANATE 875-125 MG PO TABS: 1.0000 | ORAL_TABLET | Freq: Two times a day (BID) | ORAL | 0 refills | Status: DC

## 2022-06-25 NOTE — Progress Notes (Signed)
Subjective:    Patient ID: Barbara Bradford, female    DOB: May 19, 1961, 61 y.o.   MRN: [id number]  HPI Patient is under a tremendous amount of stress.  She is caring for her mother who has dementia.  She is also caring for her father who has advanced age related medical issues.  As result she feels like she may be dealing with depression.  She feels sad.  She has trouble sleeping.  She reports feeling anxious on a daily basis.  She has poor energy.  She reports an increased appetite because she feels like she is eating due to stress.  She denies any suicidal ideation or homicidal ideation.  She denies any manic symptoms but she does report anhedonia.  She is extremely tearful today on exam.  She also has a history of hyperlipidemia and she is due to check a fasting lipid panel.  Her blood pressure today is excellent  Past Medical History:  Diagnosis Date   Abdominal bloating    Diverticulosis    history of   Gas    at times   Hx of abnormal cervical Pap smear    Osteopenia    Past Surgical History:  Procedure Laterality Date   ABDOMINAL HYSTERECTOMY     has part of one ovary   abdominaplasty  2000   AUGMENTATION MAMMAPLASTY Bilateral    implants removed a year ago   [place]     left side/had pins removed after a fracture   hx coloposcopy     REDUCTION MAMMAPLASTY Bilateral    Current Outpatient Medications on File Prior to Visit  Medication Sig Dispense Refill   albuterol (VENTOLIN HFA) 108 (90 Base) MCG/ACT inhaler Inhale 1-2 puffs into the lungs every 6 (six) hours as needed for wheezing or shortness of breath. (Patient not taking: Reported on 12/18/2021)     atorvastatin (LIPITOR) 10 MG tablet TAKE 1 TABLET(10 MG) BY MOUTH DAILY 90 tablet 1   LINZESS 145 MCG CAPS capsule TAKE 1 CAPSULE BY MOUTH DAILY BEFORE BREAKFAST 90 capsule 1   PREMARIN vaginal cream Place vaginally.     traZODone (DESYREL) 50 MG tablet TAKE 1 TABLET(50 MG) BY MOUTH  AT BEDTIME AS NEEDED FOR SLEEP 30 tablet 3   zolpidem (AMBIEN) 5 MG tablet TAKE 1 TABLET BY MOUTH EVERY DAY AT BEDTIME AS NEEDED FOR SLEEP 90 tablet 3   No current facility-administered medications on file prior to visit.   No Known Allergies Social History   Socioeconomic History   Marital status: Married    Spouse name: Not on file   Number of children: 2   Years of education: Not on file   Highest education level: Not on file  Occupational History   Not on file  Tobacco Use   Smoking status: Former    Types: Cigarettes    Quit date: 04/16/1997    Years since quitting: 25.2   Smokeless tobacco: Never  Vaping Use   Vaping Use: Never used  Substance and Sexual Activity   Alcohol use: Yes    Alcohol/week: 2.0 standard drinks of alcohol    Types: 2 Cans of beer per week   Drug use: No   Sexual activity: Yes    Partners: Male    Birth control/protection: None, Post-menopausal  Other Topics Concern   Not on file  Social History Narrative   Not on file   Social Determinants of Health   Financial  Resource Strain: Not on file  Food Insecurity: Not on file  Transportation Needs: Not on file  Physical Activity: Sufficiently Active (01/27/2018)   Exercise Vital Sign    Days of Exercise per Week: 4 days    Minutes of Exercise per Session: 90 min  Stress: Not on file  Social Connections: Not on file  Intimate Partner Violence: Not on file      Review of Systems  All other systems reviewed and are negative.      Objective:   Physical Exam Vitals reviewed.  Constitutional:      Appearance: Normal appearance.  Cardiovascular:     Rate and Rhythm: Normal rate and regular rhythm.     Heart sounds: Normal heart sounds. No murmur heard. Pulmonary:     Effort: Pulmonary effort is normal.     Breath sounds: Normal breath sounds. No wheezing, rhonchi or rales.  Neurological:     Mental Status: She is alert.           Assessment & Plan:  Hyperlipidemia,  unspecified hyperlipidemia type - Plan: CBC with Differential/Platelet, Lipid panel, COMPLETE METABOLIC PANEL WITH GFR, atorvastatin (LIPITOR) 10 MG tablet  Anxiety and depression Patient has tried Lexapro in the past and did well on it so we will start Lexapro 10 mg a day.  Continue atorvastatin but I will check a CBC a CMP and a lipid panel.  Goal LDL cholesterol is less than 130.  Blood pressure is excellent.  The patient received her flu shot today.  Reassess in 4 to 6 weeks to see if the Lexapro is helping

## 2022-06-25 NOTE — Patient Instructions (Signed)
You have been scheduled for a colonoscopy. Please follow written instructions given to you at your visit today.  Please pick up your prep supplies at the pharmacy within the next 1-3 days. If you use inhalers (even only as needed), please bring them with you on the day of your procedure.  We have sent the following medications to your pharmacy for you to pick up at your convenience: Augmentin   _____________________________________________________  If you are age 58 or older, your body mass index should be between 23-30. Your Body mass index is 27.46 kg/m. If this is out of the aforementioned range listed, please consider follow up with your Primary Care Provider.  If you are age 57 or younger, your body mass index should be between 19-25. Your Body mass index is 27.46 kg/m. If this is out of the aformentioned range listed, please consider follow up with your Primary Care Provider.   _____________________________________________________ The [facility] providers would like to encourage you to use [facility] to communicate with providers for non-urgent requests or questions.  Due to long hold times on the telephone, sending your provider a message by [facility] may be a faster and more efficient way to get a response.  Please allow 48 business hours for a response.  Please remember that this is for non-urgent requests.  _____________________________________________________  Thank you for choosing me and [facility].  Dr. Silverio Decamp

## 2022-06-25 NOTE — Addendum Note (Signed)
Addended by: Randal Buba K on: 06/25/2022 08:22 AM   Modules accepted: Orders

## 2022-06-25 NOTE — Progress Notes (Signed)
Barbara Bradford    [id number]    10/14/1960  Primary Care Physician:Pickard, Cammie Mcgee, MD  Referring Physician: Susy Frizzle, MD [address] [place],  [place] [address]   Chief complaint: Left lower quadrant abdominal pain  HPI: 61 year old very pleasant female here for evaluation of intermittent severe left lower quadrant abdominal pain She has history of diverticular disease and has had multiple episodes of recurrent diverticulitis this past year, thinks may have been at least 12-15 episodes and she did not use antibiotics every time, but is able to manage symptoms with dietary modifications.  She needed antibiotic  twice for acute diverticulitis this past year She was started on Linzess by Dr. Cindi Carbon and feels it is somewhat helping with her symptoms but has not completely resolved Denies any rectal bleeding, unintentional weight loss No family history of colon cancer  Colonoscopy January 23, 2016 - One 3 mm polyp in the ascending colon, removed with a cold biopsy forceps. Resected and retrieved. - Severe diverticulosis in the sigmoid colon and mild to moderate diverticulosis in the descending colon, in the transverse colon and in the ascending colon. - Non-bleeding internal hemorrhoids.  Outpatient Encounter Medications as of 06/25/2022  Medication Sig   atorvastatin (LIPITOR) 10 MG tablet TAKE 1 TABLET(10 MG) BY MOUTH DAILY   escitalopram (LEXAPRO) 10 MG tablet Take 1 tablet (10 mg total) by mouth daily.   LINZESS 145 MCG CAPS capsule TAKE 1 CAPSULE BY MOUTH DAILY BEFORE BREAKFAST   PREMARIN vaginal cream Place vaginally.   zolpidem (AMBIEN) 5 MG tablet Take 1 tablet (5 mg total) by mouth at bedtime as needed for sleep.   No facility-administered encounter medications on file as of 06/25/2022.    Allergies as of 06/25/2022   (No Known Allergies)    Past Medical History:  Diagnosis Date   Abdominal bloating    Diverticulosis    history of    Gas    at times   Hx of abnormal cervical Pap smear    Osteopenia     Past Surgical History:  Procedure Laterality Date   ABDOMINAL HYSTERECTOMY     has part of one ovary   abdominaplasty  2000   AUGMENTATION MAMMAPLASTY Bilateral    implants removed a year ago   [place]     left side/had pins removed after a fracture   hx coloposcopy     REDUCTION MAMMAPLASTY Bilateral     Family History  Problem Relation Age of Onset   Dementia Mother    Diabetes Mother    Stroke Father    Pancreatic cancer Sister     Social History   Socioeconomic History   Marital status: Married    Spouse name: Not on file   Number of children: 2   Years of education: Not on file   Highest education level: Not on file  Occupational History   Occupation: Freight forwarder  Tobacco Use   Smoking status: Former    Types: Cigarettes    Quit date: 04/16/1997    Years since quitting: 25.2   Smokeless tobacco: Never  Vaping Use   Vaping Use: Never used  Substance and Sexual Activity   Alcohol use: Yes    Alcohol/week: 2.0 standard drinks of alcohol    Types: 2 Cans of beer per week    Comment: on ocassion   Drug use: No  Sexual activity: Yes    Partners: Male    Birth control/protection: None, Post-menopausal  Other Topics Concern   Not on file  Social History Narrative   Not on file   Social Determinants of Health   Financial Resource Strain: Not on file  Food Insecurity: Not on file  Transportation Needs: Not on file  Physical Activity: Sufficiently Active (01/27/2018)   Exercise Vital Sign    Days of Exercise per Week: 4 days    Minutes of Exercise per Session: 90 min  Stress: Not on file  Social Connections: Not on file  Intimate Partner Violence: Not on file      Review of systems: All other review of systems negative except as mentioned in the HPI.   Physical Exam: Vitals:   06/25/22 1431  BP: (Abnormal) 140/92  Pulse: 71   Body  mass index is 27.46 kg/m. Gen:      No acute distress HEENT:  sclera anicteric Abd:      soft, left lower quadrant-tender; no palpable masses, no distension Ext:    No edema Neuro: alert and oriented x 3 Psych: normal mood and affect  Data Reviewed:  Reviewed labs, radiology imaging, old records and pertinent past GI work up   Assessment and Plan/Recommendations:  61 year old very pleasant female with history of severe sigmoid diverticulosis, recurrent sigmoid diverticulitis with persistent left lower quadrant abdominal discomfort Will plan to treat with additional course of Augmentin 875 mg twice daily for 10 days  Continue Linzess 145 mg daily  Schedule for colonoscopy for further evaluation, exclude any neoplastic lesion Patient will likely benefit from segmental sigmoidectomy given multiple episodes of acute diverticulitis within the past year, will refer to colorectal surgery after colonoscopy  The risks and benefits as well as alternatives of endoscopic procedure(s) have been discussed and reviewed. All questions answered. The patient agrees to proceed.  The patient was provided an opportunity to ask questions and all were answered. The patient agreed with the plan and demonstrated an understanding of the instructions.  Damaris Hippo , MD    CC: Susy Frizzle, MD

## 2022-06-28 ENCOUNTER — Other Ambulatory Visit: Payer: Self-pay | Admitting: Family Medicine

## 2022-06-28 DIAGNOSIS — G4700 Insomnia, unspecified: Secondary | ICD-10-CM

## 2022-07-01 NOTE — Telephone Encounter (Signed)
Requested medications are due for refill today.  no  Requested medications are on the active medications list.  yes  Last refill. 06/25/2022 #90 3 rf  Future visit scheduled.   no  Notes to clinic.  Refill not delegated. Rx was just refilled.    Requested Prescriptions  Pending Prescriptions Disp Refills   zolpidem (AMBIEN) 5 MG tablet [Pharmacy Med Name: ZOLPIDEM '5MG'$  TABLETS] 90 tablet     Sig: TAKE 1 TABLET BY MOUTH EVERY DAY AT BEDTIME AS NEEDED FOR SLEEP     Not Delegated - Psychiatry:  Anxiolytics/Hypnotics Failed - 06/28/2022  5:25 PM      Failed - This refill cannot be delegated      Failed - Urine Drug Screen completed in last 360 days      Failed - Valid encounter within last 6 months    Recent Outpatient Visits           6 months ago Perforation of right tympanic membrane   [place] Pickard, Cammie Mcgee, MD   1 year ago Need for immunization against influenza   [place] Susy Frizzle, MD   1 year ago Acute bacterial rhinosinusitis   [place] [facility], Cammie Mcgee, MD   1 year ago [place], Warren T, MD   2 years ago Dislocation of temporomandibular joint, initial encounter   [place], [place], FNP

## 2022-07-08 ENCOUNTER — Encounter: Payer: Self-pay | Admitting: Gastroenterology

## 2022-07-09 ENCOUNTER — Encounter: Payer: Self-pay | Admitting: Gastroenterology

## 2022-07-09 ENCOUNTER — Ambulatory Visit (AMBULATORY_SURGERY_CENTER): Payer: No Typology Code available for payment source | Admitting: Gastroenterology

## 2022-07-09 VITALS — BP 120/78 | HR 53 | Temp 96.8°F | Resp 13 | Ht 65.0 in | Wt 165.0 lb

## 2022-07-09 DIAGNOSIS — K5732 Diverticulitis of large intestine without perforation or abscess without bleeding: Secondary | ICD-10-CM

## 2022-07-09 DIAGNOSIS — D123 Benign neoplasm of transverse colon: Secondary | ICD-10-CM

## 2022-07-09 MED ORDER — SODIUM CHLORIDE 0.9 % IV SOLN: 500.0000 mL | Freq: Once | INTRAVENOUS | Status: DC

## 2022-07-09 NOTE — Op Note (Signed)
[place] Patient Name: Barbara Bradford Procedure Date: 07/09/2022 1:51 PM MRN: [id number] Endoscopist: Mauri Pole , MD, [id number] Age: 61 Referring MD:  Date of Birth: [birth year] Gender: Female Account #: [id number] Procedure:                Colonoscopy Indications:              Abdominal pain in the left lower quadrant,                            Follow-up of recurrent diverticulitis, Change in                            bowel habits Medicines:                Monitored Anesthesia Care Procedure:                Pre-Anesthesia Assessment:                           - Prior to the procedure, a History and Physical                            was performed, and patient medications and                            allergies were reviewed. The patient's tolerance of                            previous anesthesia was also reviewed. The risks                            and benefits of the procedure and the sedation                            options and risks were discussed with the patient.                            All questions were answered, and informed consent                            was obtained. Prior Anticoagulants: The patient has                            taken no anticoagulant or antiplatelet agents. ASA                            Grade Assessment: II - A patient with mild systemic                            disease. After reviewing the risks and benefits,                            the patient was deemed in satisfactory condition to  undergo the procedure.                           After obtaining informed consent, the colonoscope                            was passed under direct vision. Throughout the                            procedure, the patient's blood pressure, pulse, and                            oxygen saturations were monitored continuously. The                            [id number] PCF-H190TL Slim SB Colonoscope was  introduced                            through the anus and advanced to the the cecum,                            identified by appendiceal orifice and ileocecal                            valve. The colonoscopy was performed without                            difficulty. The patient tolerated the procedure                            well. The quality of the bowel preparation was                            good. The ileocecal valve, appendiceal orifice, and                            rectum were photographed. Scope In: 2:06:59 PM Scope Out: 2:23:45 PM Scope Withdrawal Time: 0 hours 10 minutes 29 seconds  Total Procedure Duration: 0 hours 16 minutes 46 seconds  Findings:                 The perianal and digital rectal examinations were                            normal.                           Two sessile polyps were found in the transverse                            colon. The polyps were 4 to 6 mm in size. These                            polyps were removed with a cold snare. Resection  and retrieval were complete.                           Multiple large-mouthed diverticula were found in                            the sigmoid colon. There was narrowing of the colon                            in association with the diverticular opening.                            Peri-diverticular erythema was seen. There was no                            evidence of diverticular bleeding.                           Scattered medium-mouthed and small-mouthed                            diverticula were found in the descending colon,                            transverse colon and ascending colon.                           Non-bleeding external and internal hemorrhoids were                            found during retroflexion. The hemorrhoids were                            small. Complications:            No immediate complications. Estimated Blood Loss:     Estimated blood  loss was minimal. Impression:               - Two 4 to 6 mm polyps in the transverse colon,                            removed with a cold snare. Resected and retrieved.                           - Severe diverticulosis in the sigmoid colon. There                            was narrowing of the colon in association with the                            diverticular opening. Peri-diverticular erythema                            was seen. There was no evidence of diverticular  bleeding.                           - Diverticulosis in the descending colon, in the                            transverse colon and in the ascending colon.                           - Non-bleeding external and internal hemorrhoids. Recommendation:           - Patient has a contact number available for                            emergencies. The signs and symptoms of potential                            delayed complications were discussed with the                            patient. Return to normal activities tomorrow.                            Written discharge instructions were provided to the                            patient.                           - Resume previous diet.                           - Continue present medications.                           - Await pathology results.                           - Repeat colonoscopy in 5-10 years for surveillance                            based on pathology results.                           - Refer to colorectal surgery (Dr.Thomas or                            Dr.White) for segmental sigmoidectomy, h/o                            recurrent sigmoid diverticulitis Mauri Pole, MD 07/09/2022 2:31:35 PM This report has been signed electronically.

## 2022-07-09 NOTE — Progress Notes (Unsigned)
Please refer to office visit note 06/25/22. No additional changes in H&P Patient is appropriate for planned procedure(s) and anesthesia in an ambulatory setting  K. Veena Ildefonso Keaney , MD [phone]   

## 2022-07-09 NOTE — Progress Notes (Unsigned)
VS completed by DT.  Pt's states no medical or surgical changes since previsit or office visit.  

## 2022-07-09 NOTE — Patient Instructions (Addendum)
Handouts provided on polyps, diverticulosis and hemorrhoids.   Resume previous diet. Continue present medications.  Repeat colonoscopy in 5-10 years for surveillance based on pathology results.  Refer to colorectal surgery (Dr. Marcello Moores or Dr. Dema Severin) for segmental sigmoidectomy, history of recurrent sigmoid diverticulosis.   YOU HAD AN ENDOSCOPIC PROCEDURE TODAY AT [facility]:   Refer to the procedure report that was given to you for any specific questions about what was found during the examination.  If the procedure report does not answer your questions, please call your gastroenterologist to clarify.  If you requested that your care partner not be given the details of your procedure findings, then the procedure report has been included in a sealed envelope for you to review at your convenience later.  YOU SHOULD EXPECT: Some feelings of bloating in the abdomen. Passage of more gas than usual.  Walking can help get rid of the air that was put into your GI tract during the procedure and reduce the bloating. If you had a lower endoscopy (such as a colonoscopy or flexible sigmoidoscopy) you may notice spotting of blood in your stool or on the toilet paper. If you underwent a bowel prep for your procedure, you may not have a normal bowel movement for a few days.  Please Note:  You might notice some irritation and congestion in your nose or some drainage.  This is from the oxygen used during your procedure.  There is no need for concern and it should clear up in a day or so.  SYMPTOMS TO REPORT IMMEDIATELY:  Following lower endoscopy (colonoscopy or flexible sigmoidoscopy):  Excessive amounts of blood in the stool  Significant tenderness or worsening of abdominal pains  Swelling of the abdomen that is new, acute  Fever of 100F or higher  For urgent or emergent issues, a gastroenterologist can be reached at any hour by calling [phone]. Do not use MyChart messaging for urgent  concerns.    DIET:  We do recommend a small meal at first, but then you may proceed to your regular diet.  Drink plenty of fluids but you should avoid alcoholic beverages for 24 hours.  ACTIVITY:  You should plan to take it easy for the rest of today and you should NOT DRIVE or use heavy machinery until tomorrow (because of the sedation medicines used during the test).    FOLLOW UP: Our staff will call the number listed on your records the next business day following your procedure.  We will call around 7:15- 8:00 am to check on you and address any questions or concerns that you may have regarding the information given to you following your procedure. If we do not reach you, we will leave a message.     If any biopsies were taken you will be contacted by phone or by letter within the next 1-3 weeks.  Please call us at [phone] if you have not heard about the biopsies in 3 weeks.    SIGNATURES/CONFIDENTIALITY: You and/or your care partner have signed paperwork which will be entered into your electronic medical record.  These signatures attest to the fact that that the information above on your After Visit Summary has been reviewed and is understood.  Full responsibility of the confidentiality of this discharge information lies with you and/or your care-partner.

## 2022-07-09 NOTE — Progress Notes (Signed)
Per MD orders pt given Levsin 0.'25mg'$  and Simethicone 0.36m in PACU d/t known sigmoid stricture r/t severe diverticulosis. Pt passing air in recovery. Pt states she is mildly crampy but feels stable for discharge. Instructed pt on other ways to relieve gas discomfort at home. Pt verbalized understanding.

## 2022-07-09 NOTE — Progress Notes (Signed)
Called to room to assist during endoscopic procedure.  Patient ID and intended procedure confirmed with present staff. Received instructions for my participation in the procedure from the performing physician.  

## 2022-07-09 NOTE — Progress Notes (Signed)
Vss nad trans to pacu °

## 2022-07-10 ENCOUNTER — Telehealth: Payer: Self-pay

## 2022-07-10 NOTE — Telephone Encounter (Signed)
Left message on follow up call. 

## 2022-07-15 ENCOUNTER — Encounter: Payer: Self-pay | Admitting: Gastroenterology

## 2022-07-15 ENCOUNTER — Telehealth: Payer: Self-pay

## 2022-07-15 NOTE — Telephone Encounter (Signed)
Demographics, insurance information and medical records faxed to [facility] for referral. Segmental sigmoidectomy for recurrent sigmoid diverticulitis.

## 2022-07-18 ENCOUNTER — Encounter: Payer: Self-pay | Admitting: Gastroenterology

## 2022-07-19 ENCOUNTER — Ambulatory Visit (HOSPITAL_BASED_OUTPATIENT_CLINIC_OR_DEPARTMENT_OTHER): Payer: No Typology Code available for payment source | Admitting: Family Medicine

## 2022-08-08 ENCOUNTER — Other Ambulatory Visit: Payer: Self-pay | Admitting: Urology

## 2022-09-26 ENCOUNTER — Other Ambulatory Visit (HOSPITAL_COMMUNITY): Payer: Self-pay | Admitting: Student

## 2022-09-26 DIAGNOSIS — K5732 Diverticulitis of large intestine without perforation or abscess without bleeding: Secondary | ICD-10-CM

## 2022-09-27 ENCOUNTER — Ambulatory Visit (HOSPITAL_COMMUNITY)
Admission: RE | Admit: 2022-09-27 | Discharge: 2022-09-27 | Disposition: A | Discharge status: Home / Self Care | Payer: No Typology Code available for payment source | Source: Ambulatory Visit | Attending: Student

## 2022-09-27 DIAGNOSIS — K5732 Diverticulitis of large intestine without perforation or abscess without bleeding: Secondary | ICD-10-CM | POA: Diagnosis present

## 2022-09-27 MED ORDER — IOHEXOL 300 MG/ML  SOLN
100.0000 mL | Freq: Once | INTRAMUSCULAR | Status: AC | PRN
  Administered 2022-09-27: 100 mL via INTRAVENOUS

## 2022-10-07 NOTE — Progress Notes (Signed)
Sent message, via epic in basket, requesting orders in epic from surgeon.  

## 2022-10-11 NOTE — Patient Instructions (Addendum)
SURGICAL WAITING ROOM VISITATION Patients having surgery or a procedure may have no more than 2 support people in the waiting area - these visitors may rotate in the visitor waiting room.   Due to an increase in RSV and influenza rates and associated hospitalizations, children ages 45 and under may not visit patients in [place]. If the patient needs to stay at the hospital during part of their recovery, the visitor guidelines for inpatient rooms apply.  PRE-OP VISITATION  Pre-op nurse will coordinate an appropriate time for 1 support person to accompany the patient in pre-op.  This support person may not rotate.  This visitor will be contacted when the time is appropriate for the visitor to come back in the pre-op area.  Please refer to the [facility] website for the visitor guidelines for Inpatients (after your surgery is over and you are in a regular room).  You are not required to quarantine at this time prior to your surgery. However, you must do this: Hand Hygiene often Do NOT share personal items Notify your provider if you are in close contact with someone who has COVID or you develop fever 100.4 or greater, new onset of sneezing, cough, sore throat, shortness of breath or body aches.  If you test positive for Covid or have been in contact with anyone that has tested positive in the last 10 days please notify you surgeon.    Your procedure is scheduled on:  Friday  October 25, 2022  Report to [facility] Main Entrance: [place] entrance where the [employer] is available.   Report to admitting at: 05:15 AM  +++++Call this number if you have any questions or problems the morning of surgery [phone]  DO NOT EAT OR DRINK ANYTHING AFTER MIDNIGHT THE NIGHT PRIOR TO YOUR SURGERY / PROCEDURE.   FOLLOW BOWEL PREP AND ANY ADDITIONAL PRE OP INSTRUCTIONS YOU RECEIVED FROM YOUR SURGEON'S OFFICE!!! Dulcolax ?   mg - Take ?? (?) tablets with water at 07:00 am the day  prior to surgery.  Miralax 255 g - Mix with 64 oz Gatorade/Powerade.  Starting at 10:00 am ,Drink this gradually over the next few hours (8 oz glass every 15-30 minutes) until gone the day prior to surgery You should finish in 4 hours-6 hours.    Neomycin 1000 mg - At 2 pm, 3 pm and 10 pm after Miralax  bowel prep the day prior to surgery.  Metronidazole 1000 mg - At 2 pm, 3 pm and 10 pm after Miralax bowel prep the day prior to surgery.   Drink plenty of clear liquids all evening to avoid getting dehydrated.    Oral Hygiene is also important to reduce your risk of infection.        Remember - BRUSH YOUR TEETH THE MORNING OF SURGERY WITH YOUR REGULAR TOOTHPASTE  Do NOT smoke after Midnight the night before surgery.  Take ONLY these medicines the morning of surgery with A SIP OF WATER: escitalopram (Lexapro)                    You may not have any metal on your body including hair pins, jewelry, and body piercing  Do not wear make-up, lotions, powders, perfumes or deodorant  Do not wear nail polish including gel and S&S, artificial / acrylic nails, or any other type of covering on natural nails including finger and toenails. If you have artificial nails, gel coating, etc., that needs to be removed  by a nail salon, Please have this removed prior to surgery. Not doing so may mean that your surgery could be cancelled or delayed if the Surgeon or anesthesia staff feels like they are unable to monitor you safely.   Do not shave 48 hours prior to surgery to avoid nicks in your skin which may contribute to postoperative infections.   Contacts, Hearing Aids, dentures or bridgework may not be worn into surgery. DENTURES WILL BE REMOVED PRIOR TO SURGERY PLEASE DO NOT APPLY "Poly grip" OR ADHESIVES!!!  You may bring a small overnight bag with you on the day of surgery, only pack items that are not valuable. [facility] IS NOT RESPONSIBLE   FOR VALUABLES THAT ARE LOST OR STOLEN.   Do not bring your  home medications to the hospital. The Pharmacy will dispense medications listed on your medication list to you during your admission in the Hospital.  Special Instructions: Bring a copy of your healthcare power of attorney and living will documents the day of surgery, if you wish to have them scanned into your [facility] Records- EPIC  Please read over the following fact sheets you were given: IF YOU HAVE QUESTIONS ABOUT YOUR [place], [place] [phone].   [facility] - Preparing for Surgery Before surgery, you can play an important role.  Because skin is not sterile, your skin needs to be as free of germs as possible.  You can reduce the number of germs on your skin by washing with CHG (chlorahexidine gluconate) soap before surgery.  CHG is an antiseptic cleaner which kills germs and bonds with the skin to continue killing germs even after washing. Please DO NOT use if you have an allergy to CHG or antibacterial soaps.  If your skin becomes reddened/irritated stop using the CHG and inform your nurse when you arrive at Short Stay. Do not shave (including legs and underarms) for at least 48 hours prior to the first CHG shower.  You may shave your face/neck.  Please follow these instructions carefully:  1.  Shower with CHG Soap the night before surgery and the  morning of surgery.  2.  If you choose to wash your hair, wash your hair first as usual with your normal  shampoo.  3.  After you shampoo, rinse your hair and body thoroughly to remove the shampoo.                             4.  Use CHG as you would any other liquid soap.  You can apply chg directly to the skin and wash.  Gently with a scrungie or clean washcloth.  5.  Apply the CHG Soap to your body ONLY FROM THE NECK DOWN.   Do not use on face/ open                           Wound or open sores. Avoid contact with eyes, ears mouth and genitals (private parts).                       Wash face,  Genitals (private  parts) with your normal soap.             6.  Wash thoroughly, paying special attention to the area where your  surgery  will be performed.  7.  Thoroughly rinse your body with warm water from the neck  down.  8.  DO NOT shower/wash with your normal soap after using and rinsing off the CHG Soap.            9.  Pat yourself dry with a clean towel.            10.  Wear clean pajamas.            11.  Place clean sheets on your bed the night of your first shower and do not  sleep with pets.  ON THE DAY OF SURGERY : Do not apply any lotions/deodorants the morning of surgery.  Please wear clean clothes to the hospital/surgery center.    FAILURE TO FOLLOW THESE INSTRUCTIONS MAY RESULT IN THE CANCELLATION OF YOUR SURGERY  PATIENT SIGNATURE_________________________________  NURSE SIGNATURE__________________________________  ________________________________________________________________________

## 2022-10-11 NOTE — Progress Notes (Signed)
Sent another IB message to both Drs. Hassell Done and [place] for orders. Patient is coming in on Monday for PST appt

## 2022-10-11 NOTE — Progress Notes (Signed)
COVID Vaccine received:  '[]'$  No '[x]'$  Yes Date of any COVID positive Test in last 4 days:none  PCP - Jenna Luo, MD Cardiologist - None  Chest x-ray -  EKG -  03-19-2022  epic Stress Test -  ECHO -  Cardiac Cath -   PCR screen: '[]'$  Ordered & Completed                      '[x]'$   No Order but Needs PROFEND                      '[]'$   N/A for this surgery  Surgery Plan:  '[]'$  Ambulatory                            '[]'$  Outpatient in bed                            '[x]'$  Admit  Anesthesia:    '[x]'$  General  '[]'$  Spinal                           '[]'$   Choice '[]'$   MAC  Bowel Prep - '[]'$  No  '[x]'$   Yes _Neomycin, Flagyl etc.___  Pacemaker / ICD device '[x]'$  No '[]'$  Yes        Device order form faxed '[x]'$  No    '[]'$   Yes      Faxed to:  Spinal Cord Stimulator:'[x]'$  No '[]'$  Yes      (Remind patient to bring remote DOS) Other Implants:   History of Sleep Apnea? '[x]'$  No '[]'$  Yes   CPAP used?- '[x]'$  No '[]'$  Yes    Does the patient monitor blood sugar? '[]'$  No '[]'$  Yes  '[x]'$  N/A  Blood Thinner / Instructions: none Aspirin Instructions:  none  ERAS Protocol Ordered: '[]'$  No  '[]'$  Yes PRE-SURGERY '[]'$  ENSURE  '[]'$  G2  '[]'$  No Drink Ordered  Patient is to be NPO after: midnight prior  Comments: No orders from Dr. Hassell Done as of 10-14-22 at PST appt. Dr. Junious Silk did have a consent order placed but the patient will need to sign both consents on DOS.   Activity level: Patient is able to climb a flight of stairs without difficulty; '[x]'$  No CP  '[x]'$  No SOB. Patient can perform ADLs without assistance.   Anesthesia review: anxiety, no pertinent surgical or medical history. Has Diverticulitis- reason for surgery  Patient denies shortness of breath, fever, cough and chest pain at PAT appointment.  Patient verbalized understanding and agreement to the Pre-Surgical Instructions that were given to them at this PAT appointment. Patient was also educated of the need to review these PAT instructions again prior to his/her surgery.I reviewed the  appropriate phone numbers to call if they have any and questions or concerns.

## 2022-10-14 ENCOUNTER — Other Ambulatory Visit: Payer: Self-pay

## 2022-10-14 ENCOUNTER — Encounter (HOSPITAL_COMMUNITY)
Admission: RE | Admit: 2022-10-14 | Discharge: 2022-10-14 | Disposition: A | Discharge status: Home / Self Care | Payer: No Typology Code available for payment source | Source: Ambulatory Visit | Attending: Surgery | Admitting: Surgery

## 2022-10-14 ENCOUNTER — Encounter (HOSPITAL_COMMUNITY): Payer: Self-pay

## 2022-10-14 VITALS — BP 146/74 | HR 56 | Temp 98.0°F | Resp 16 | Ht 66.0 in | Wt 165.2 lb

## 2022-10-14 DIAGNOSIS — Z01818 Encounter for other preprocedural examination: Secondary | ICD-10-CM

## 2022-10-14 DIAGNOSIS — E785 Hyperlipidemia, unspecified: Secondary | ICD-10-CM | POA: Diagnosis not present

## 2022-10-14 DIAGNOSIS — Z01812 Encounter for preprocedural laboratory examination: Secondary | ICD-10-CM | POA: Diagnosis present

## 2022-10-14 HISTORY — DX: Unspecified osteoarthritis, unspecified site: M19.90

## 2022-10-14 HISTORY — DX: Anxiety disorder, unspecified: F41.9

## 2022-10-14 LAB — CBC
HCT: 42.3 % (ref 36.0–46.0)
Hemoglobin: 13.5 g/dL (ref 12.0–15.0)
MCH: 28.2 pg (ref 26.0–34.0)
MCHC: 31.9 g/dL (ref 30.0–36.0)
MCV: 88.3 fL (ref 80.0–100.0)
Platelets: 240 10*3/uL (ref 150–400)
RBC: 4.79 MIL/uL (ref 3.87–5.11)
RDW: 13.5 % (ref 11.5–15.5)
WBC: 4.6 10*3/uL (ref 4.0–10.5)
nRBC: 0 % (ref 0.0–0.2)

## 2022-10-15 ENCOUNTER — Ambulatory Visit: Payer: Self-pay | Admitting: Surgery

## 2022-10-15 DIAGNOSIS — K5732 Diverticulitis of large intestine without perforation or abscess without bleeding: Secondary | ICD-10-CM

## 2022-10-22 ENCOUNTER — Ambulatory Visit: Payer: No Typology Code available for payment source | Admitting: Family Medicine

## 2022-10-22 ENCOUNTER — Encounter: Payer: Self-pay | Admitting: Family Medicine

## 2022-10-22 ENCOUNTER — Other Ambulatory Visit: Payer: Self-pay | Admitting: Family Medicine

## 2022-10-22 VITALS — BP 115/70 | HR 75 | Temp 98.3°F | Ht 66.0 in | Wt 167.0 lb

## 2022-10-22 DIAGNOSIS — J329 Chronic sinusitis, unspecified: Secondary | ICD-10-CM

## 2022-10-22 DIAGNOSIS — R911 Solitary pulmonary nodule: Secondary | ICD-10-CM

## 2022-10-22 NOTE — Assessment & Plan Note (Signed)
Patient is here today with very postnasal drip, we discussed this could be seasonal allergies versus a viral illness. I encouraged her to continue to OTC symptom management. May use Flonase for nasal congestion and try Zyrtec. Reassured patient that if this is a viral illness antibiotics are ineffective. Encouraged to return to office if symptoms persist or worsen and suggested Covid testing in the setting of her planned sigmoid colectomy Friday.

## 2022-10-22 NOTE — Progress Notes (Signed)
Acute Office Visit  Subjective:     Patient ID: Barbara Bradford, female    DOB: 09-Oct-1960, 62 y.o.   MRN: [id number]  Chief Complaint  Patient presents with   Acute Visit    scratchy throat (Feels thick/swollen), slight cough (allergies?) - JBG\\\      HPI Patient is in today for scratchy throat, ear pressure, and mild cough since yesterday Denies fever, congestion, shortness of breath, wheezing, runny nose, itchy eyes No known sick exposure or home COVID test Has tried nothing.   Review of Systems  All other systems reviewed and are negative.   Past Medical History:  Diagnosis Date   Abdominal bloating    Anxiety    Arthritis    Diverticulosis    history of   Gas    at times   Hx of abnormal cervical Pap smear    Osteopenia    Past Surgical History:  Procedure Laterality Date   ABDOMINAL HYSTERECTOMY     has part of one ovary   abdominaplasty  2000   AUGMENTATION MAMMAPLASTY Bilateral    implants removed a year ago   [place]     left side/had pins removed after a fracture   COLONOSCOPY     hx coloposcopy     REDUCTION MAMMAPLASTY Bilateral    Current Outpatient Medications on File Prior to Visit  Medication Sig Dispense Refill   atorvastatin (LIPITOR) 10 MG tablet TAKE 1 TABLET(10 MG) BY MOUTH DAILY 90 tablet 3   escitalopram (LEXAPRO) 10 MG tablet Take 1 tablet (10 mg total) by mouth daily. 90 tablet 3   zolpidem (AMBIEN) 5 MG tablet Take 1 tablet (5 mg total) by mouth at bedtime as needed for sleep. 90 tablet 3   No current facility-administered medications on file prior to visit.   No Known Allergies     Objective:    BP 115/70   Pulse 75   Temp 98.3 F (36.8 C) (Oral)   Ht '5\' 6"'$  (1.676 m)   Wt 167 lb (75.8 kg)   SpO2 95%   BMI 26.95 kg/m    Physical Exam Vitals and nursing note reviewed.  Constitutional:      Appearance: Normal appearance. She is normal weight.  HENT:     Head:  Normocephalic and atraumatic.     Right Ear: Tympanic membrane, ear canal and external ear normal.     Left Ear: Tympanic membrane, ear canal and external ear normal.     Nose: Nose normal.     Mouth/Throat:     Mouth: Mucous membranes are moist.     Pharynx: Oropharynx is clear. Posterior oropharyngeal erythema present. No oropharyngeal exudate.  Eyes:     Conjunctiva/sclera: Conjunctivae normal.  Cardiovascular:     Rate and Rhythm: Normal rate and regular rhythm.     Pulses: Normal pulses.     Heart sounds: Normal heart sounds.  Pulmonary:     Effort: Pulmonary effort is normal.     Breath sounds: Normal breath sounds.  Musculoskeletal:     Cervical back: No tenderness.  Lymphadenopathy:     Cervical: No cervical adenopathy.  Skin:    General: Skin is warm and dry.  Neurological:     General: No focal deficit present.     Mental Status: She is alert and oriented to person, place, and time. Mental status is at baseline.  Psychiatric:  Mood and Affect: Mood normal.        Behavior: Behavior normal.        Thought Content: Thought content normal.        Judgment: Judgment normal.     No results found for any visits on 10/22/22.      Assessment & Plan:   Problem List Items Addressed This Visit       Respiratory   Rhinosinusitis - Primary    Patient is here today with very postnasal drip, we discussed this could be seasonal allergies versus a viral illness. I encouraged her to continue to OTC symptom management. May use Flonase for nasal congestion and try Zyrtec. Reassured patient that if this is a viral illness antibiotics are ineffective. Encouraged to return to office if symptoms persist or worsen and suggested Covid testing in the setting of her planned sigmoid colectomy Friday.       No orders of the defined types were placed in this encounter.   No follow-ups on file.  Rubie Maid, FNP

## 2022-10-24 ENCOUNTER — Encounter (HOSPITAL_COMMUNITY): Payer: Self-pay | Admitting: Surgery

## 2022-10-24 NOTE — H&P (Signed)
Chief Complaint:  recurrent diverticulitis  History of Present Illness:  Barbara Bradford is an 62 y.o. female referred by Dr. Silverio Decamp and seen by me in December.  Subsequently, she had recurrent pain and had a CT in February that showed diverticulitis in the sigmoid.  On a prior colonoscopy by Dr. 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 Informed consent obtained in the office and preop.    Past Medical History:  Diagnosis Date   Abdominal bloating    Anxiety    Arthritis    Diverticulosis    history of   Gas    at times   Hx of abnormal cervical Pap smear    Osteopenia     Past Surgical History:  Procedure Laterality Date   ABDOMINAL HYSTERECTOMY     has part of one ovary   abdominaplasty  2000   AUGMENTATION MAMMAPLASTY Bilateral    implants removed a year ago   [place]     left side/had pins removed after a fracture   COLONOSCOPY     hx coloposcopy     REDUCTION MAMMAPLASTY Bilateral     No current facility-administered medications for this encounter.   Current Outpatient Medications  Medication Sig Dispense Refill   atorvastatin (LIPITOR) 10 MG tablet TAKE 1 TABLET(10 MG) BY MOUTH DAILY 90 tablet 3   escitalopram (LEXAPRO) 10 MG tablet Take 1 tablet (10 mg total) by mouth daily. 90 tablet 3   zolpidem (AMBIEN) 5 MG tablet Take 1 tablet (5 mg total) by mouth at bedtime as needed for sleep. 90 tablet 3   Patient has no allergy information on record. Family History  Problem Relation Age of Onset   Dementia Mother    Diabetes Mother    Stroke Father    Pancreatic cancer Sister    Social History:   reports that she quit smoking about 25 years ago. Her smoking use included cigarettes. She has never used smokeless tobacco. She reports current alcohol use of about 2.0 standard drinks of alcohol per week. She reports that she does not use drugs.   REVIEW OF SYSTEMS : Negative except for see problem list  Physical  Exam:   There were no vitals taken for this visit. There is no height or weight on file to calculate BMI.  Gen:  WDWN WF NAD  Neurological: Alert and oriented to person, place, and time. Motor and sensory function is grossly intact  Head: Normocephalic and atraumatic.  Eyes: Conjunctivae are normal. Pupils are equal, round, and reactive to light. No scleral icterus.  Neck: Normal range of motion. Neck supple. No tracheal deviation or thyromegaly present.  Cardiovascular:  SR without murmurs or gallops.  No carotid bruits Breast:  not examined Respiratory: Effort normal.  No respiratory distress. No chest wall tenderness. Breath sounds normal.  No wheezes, rales or rhonchi.  Abdomen:  nontender at present GU:  not examined Musculoskeletal: Normal range of motion. Extremities are nontender. No cyanosis, edema or clubbing noted Lymphadenopathy: No cervical, preauricular, postauricular or axillary adenopathy is present Skin: Skin is warm and dry. No rash noted. No diaphoresis. No erythema. No pallor. Pscyh: Normal mood and affect. Behavior is normal. Judgment and thought content normal.   LABORATORY RESULTS: No results found for this or any previous visit (from the past 48 hour(s)).   RADIOLOGY RESULTS: No results found.  Problem List: Patient Active Problem List   Diagnosis Date Noted  Rhinosinusitis 10/22/2022   Osteopenia    Diverticulosis 06/27/2018   Anxiety and depression 06/27/2018   Other abnormal glucose 06/27/2018   Hyperlipemia 04/21/2018   Overweight (BMI 25.0-29.9) 04/16/2018   Insomnia 04/16/2018   Constipation 07/10/2010    Assessment & Plan: Recurrent  diverticulitis  for sigmoid colectomy.      Matt B. Hassell Done, MD, [facility] [phone] beeper [phone]  10/24/2022 8:51 PM    re

## 2022-10-24 NOTE — Anesthesia Preprocedure Evaluation (Addendum)
Anesthesia Evaluation  Patient identified by MRN, date of birth, ID band Patient awake    Reviewed: Allergy & Precautions, NPO status , Patient's Chart, lab work & pertinent test results  History of Anesthesia Complications Negative for: history of anesthetic complications  Airway Mallampati: II  TM Distance: >3 FB Neck ROM: Full    Dental no notable dental hx. (+) Teeth Intact, Dental Advisory Given   Pulmonary former smoker   Pulmonary exam normal        Cardiovascular negative cardio ROS Normal cardiovascular exam     Neuro/Psych   Anxiety Depression    negative neurological ROS     GI/Hepatic negative GI ROS, Neg liver ROS,,,Diverticulitis   Endo/Other  negative endocrine ROS    Renal/GU negative Renal ROS  negative genitourinary   Musculoskeletal  (+) Arthritis ,    Abdominal   Peds  Hematology negative hematology ROS (+)   Anesthesia Other Findings Day of surgery medications reviewed with patient.  Reproductive/Obstetrics negative OB ROS                              Anesthesia Physical Anesthesia Plan  ASA: 2  Anesthesia Plan: General   Post-op Pain Management: Tylenol PO (pre-op)* and Ketamine IV*   Induction: Intravenous  PONV Risk Score and Plan: 3 and Treatment may vary due to age or medical condition, Ondansetron, Dexamethasone, Midazolam, Scopolamine patch - Pre-op and Propofol infusion  Airway Management Planned: Oral ETT  Additional Equipment: None  Intra-op Plan:   Post-operative Plan: Extubation in OR  Informed Consent: I have reviewed the patients History and Physical, chart, labs and discussed the procedure including the risks, benefits and alternatives for the proposed anesthesia with the patient or authorized representative who has indicated his/her understanding and acceptance.     Dental advisory given  Plan Discussed with: CRNA  Anesthesia  Plan Comments:          Anesthesia Quick Evaluation

## 2022-10-25 ENCOUNTER — Inpatient Hospital Stay (HOSPITAL_COMMUNITY): Payer: No Typology Code available for payment source | Admitting: Anesthesiology

## 2022-10-25 ENCOUNTER — Inpatient Hospital Stay (HOSPITAL_COMMUNITY)
Admission: RE | Admit: 2022-10-25 | Discharge: 2022-10-29 | DRG: 330 | Disposition: A | Discharge status: Home / Self Care | Payer: No Typology Code available for payment source | Source: Ambulatory Visit | Attending: Surgery | Admitting: Surgery

## 2022-10-25 ENCOUNTER — Other Ambulatory Visit: Payer: Self-pay

## 2022-10-25 ENCOUNTER — Encounter (HOSPITAL_COMMUNITY): Payer: Self-pay | Admitting: Surgery

## 2022-10-25 ENCOUNTER — Inpatient Hospital Stay (HOSPITAL_COMMUNITY): Payer: No Typology Code available for payment source

## 2022-10-25 ENCOUNTER — Encounter (HOSPITAL_COMMUNITY)
Admission: RE | Disposition: A | Discharge status: Home / Self Care | Payer: Self-pay | Source: Ambulatory Visit | Attending: Surgery

## 2022-10-25 DIAGNOSIS — Z9049 Acquired absence of other specified parts of digestive tract: Principal | ICD-10-CM

## 2022-10-25 DIAGNOSIS — K56609 Unspecified intestinal obstruction, unspecified as to partial versus complete obstruction: Secondary | ICD-10-CM | POA: Diagnosis not present

## 2022-10-25 DIAGNOSIS — Z90710 Acquired absence of both cervix and uterus: Secondary | ICD-10-CM

## 2022-10-25 DIAGNOSIS — K56699 Other intestinal obstruction unspecified as to partial versus complete obstruction: Secondary | ICD-10-CM | POA: Diagnosis present

## 2022-10-25 DIAGNOSIS — Z800 Family history of malignant neoplasm of digestive organs: Secondary | ICD-10-CM | POA: Diagnosis not present

## 2022-10-25 DIAGNOSIS — E785 Hyperlipidemia, unspecified: Secondary | ICD-10-CM | POA: Diagnosis present

## 2022-10-25 DIAGNOSIS — Z79899 Other long term (current) drug therapy: Secondary | ICD-10-CM

## 2022-10-25 DIAGNOSIS — Z87891 Personal history of nicotine dependence: Secondary | ICD-10-CM

## 2022-10-25 DIAGNOSIS — K5732 Diverticulitis of large intestine without perforation or abscess without bleeding: Secondary | ICD-10-CM

## 2022-10-25 DIAGNOSIS — Z823 Family history of stroke: Secondary | ICD-10-CM

## 2022-10-25 DIAGNOSIS — K5720 Diverticulitis of large intestine with perforation and abscess without bleeding: Secondary | ICD-10-CM | POA: Diagnosis present

## 2022-10-25 DIAGNOSIS — Z833 Family history of diabetes mellitus: Secondary | ICD-10-CM

## 2022-10-25 DIAGNOSIS — R109 Unspecified abdominal pain: Secondary | ICD-10-CM | POA: Diagnosis present

## 2022-10-25 HISTORY — PX: LAPAROSCOPIC SIGMOID COLECTOMY: SHX5928

## 2022-10-25 HISTORY — PX: CYSTOSCOPY WITH STENT PLACEMENT: SHX5790

## 2022-10-25 LAB — CBC
HCT: 38.3 % (ref 36.0–46.0)
Hemoglobin: 12.3 g/dL (ref 12.0–15.0)
MCH: 28.5 pg (ref 26.0–34.0)
MCHC: 32.1 g/dL (ref 30.0–36.0)
MCV: 88.7 fL (ref 80.0–100.0)
Platelets: 234 10*3/uL (ref 150–400)
RBC: 4.32 MIL/uL (ref 3.87–5.11)
RDW: 13.3 % (ref 11.5–15.5)
WBC: 11.3 10*3/uL — ABNORMAL HIGH (ref 4.0–10.5)
nRBC: 0 % (ref 0.0–0.2)

## 2022-10-25 LAB — CREATININE, SERUM
Creatinine, Ser: 0.77 mg/dL (ref 0.44–1.00)
GFR, Estimated: >60 mL/min (ref >60)

## 2022-10-25 SURGERY — COLECTOMY, SIGMOID, LAPAROSCOPIC
Anesthesia: General

## 2022-10-25 MED ORDER — KETAMINE HCL 50 MG/5ML IJ SOSY
PREFILLED_SYRINGE | INTRAMUSCULAR | Status: AC
  Filled 2022-10-25: qty 5

## 2022-10-25 MED ORDER — IOHEXOL 300 MG/ML  SOLN
INTRAMUSCULAR | Status: DC | PRN
  Administered 2022-10-25: 10 mL via URETHRAL

## 2022-10-25 MED ORDER — MORPHINE SULFATE (PF) 2 MG/ML IV SOLN
1.0000 mg | INTRAVENOUS | Status: DC | PRN
  Administered 2022-10-25 – 2022-10-29 (×16): 1 mg via INTRAVENOUS
  Filled 2022-10-25 (×16): qty 1

## 2022-10-25 MED ORDER — AMISULPRIDE (ANTIEMETIC) 5 MG/2ML IV SOLN: 10.0000 mg | Freq: Once | INTRAVENOUS | Status: DC | PRN

## 2022-10-25 MED ORDER — PHENYLEPHRINE 80 MCG/ML (10ML) SYRINGE FOR IV PUSH (FOR BLOOD PRESSURE SUPPORT)
PREFILLED_SYRINGE | INTRAVENOUS | Status: DC | PRN
  Administered 2022-10-25 (×2): 80 ug via INTRAVENOUS

## 2022-10-25 MED ORDER — LIDOCAINE HCL (PF) 2 % IJ SOLN
INTRAMUSCULAR | Status: AC
  Filled 2022-10-25: qty 5

## 2022-10-25 MED ORDER — SODIUM CHLORIDE 0.9 % IV SOLN
2.0000 g | INTRAVENOUS | Status: AC
  Administered 2022-10-25: 2 g via INTRAVENOUS
  Filled 2022-10-25: qty 2

## 2022-10-25 MED ORDER — LIDOCAINE HCL (PF) 2 % IJ SOLN
INTRAMUSCULAR | Status: DC | PRN
  Administered 2022-10-25: 1.5 mg/kg/h via INTRADERMAL

## 2022-10-25 MED ORDER — BUPIVACAINE LIPOSOME 1.3 % IJ SUSP: 20.0000 mL | Freq: Once | INTRAMUSCULAR | Status: DC

## 2022-10-25 MED ORDER — ENSURE SURGERY PO LIQD: 237.0000 mL | Freq: Two times a day (BID) | ORAL | Status: DC

## 2022-10-25 MED ORDER — PHENYLEPHRINE HCL-NACL 20-0.9 MG/250ML-% IV SOLN
INTRAVENOUS | Status: DC | PRN
  Administered 2022-10-25: 40 ug/min via INTRAVENOUS

## 2022-10-25 MED ORDER — SCOPOLAMINE 1 MG/3DAYS TD PT72
1.0000 | MEDICATED_PATCH | Freq: Once | TRANSDERMAL | Status: DC
  Administered 2022-10-25: 1.5 mg via TRANSDERMAL
  Filled 2022-10-25: qty 1

## 2022-10-25 MED ORDER — FENTANYL CITRATE (PF) 250 MCG/5ML IJ SOLN
INTRAMUSCULAR | Status: AC
  Filled 2022-10-25: qty 5

## 2022-10-25 MED ORDER — CHLORHEXIDINE GLUCONATE 0.12 % MT SOLN
15.0000 mL | Freq: Once | OROMUCOSAL | Status: AC
  Administered 2022-10-25: 15 mL via OROMUCOSAL

## 2022-10-25 MED ORDER — ONDANSETRON HCL 4 MG/2ML IJ SOLN: 4.0000 mg | Freq: Four times a day (QID) | INTRAMUSCULAR | Status: DC | PRN

## 2022-10-25 MED ORDER — KCL IN DEXTROSE-NACL 20-5-0.45 MEQ/L-%-% IV SOLN
INTRAVENOUS | Status: AC
  Filled 2022-10-25: qty 1000

## 2022-10-25 MED ORDER — ACETAMINOPHEN 500 MG PO TABS
1000.0000 mg | ORAL_TABLET | ORAL | Status: AC
  Administered 2022-10-25: 1000 mg via ORAL
  Filled 2022-10-25: qty 2

## 2022-10-25 MED ORDER — LIDOCAINE HCL URETHRAL/MUCOSAL 2 % EX GEL
CUTANEOUS | Status: AC
  Filled 2022-10-25: qty 30

## 2022-10-25 MED ORDER — ENSURE PRE-SURGERY PO LIQD
592.0000 mL | Freq: Once | ORAL | Status: DC
  Filled 2022-10-25: qty 592

## 2022-10-25 MED ORDER — PROPOFOL 10 MG/ML IV BOLUS
INTRAVENOUS | Status: AC
  Filled 2022-10-25: qty 20

## 2022-10-25 MED ORDER — ONDANSETRON HCL 4 MG PO TABS: 4.0000 mg | ORAL_TABLET | Freq: Four times a day (QID) | ORAL | Status: DC | PRN

## 2022-10-25 MED ORDER — METOPROLOL TARTRATE 5 MG/5ML IV SOLN: 5.0000 mg | Freq: Four times a day (QID) | INTRAVENOUS | Status: DC | PRN

## 2022-10-25 MED ORDER — EPHEDRINE 5 MG/ML INJ
INTRAVENOUS | Status: AC
  Filled 2022-10-25: qty 5

## 2022-10-25 MED ORDER — KETAMINE HCL-SODIUM CHLORIDE 100-0.9 MG/10ML-% IV SOSY
PREFILLED_SYRINGE | INTRAVENOUS | Status: DC | PRN
  Administered 2022-10-25: 30 mg via INTRAVENOUS

## 2022-10-25 MED ORDER — SODIUM CHLORIDE (PF) 0.9 % IJ SOLN
INTRAMUSCULAR | Status: AC
  Filled 2022-10-25: qty 20

## 2022-10-25 MED ORDER — ROCURONIUM BROMIDE 10 MG/ML (PF) SYRINGE
PREFILLED_SYRINGE | INTRAVENOUS | Status: AC
  Filled 2022-10-25: qty 10

## 2022-10-25 MED ORDER — ONDANSETRON HCL 4 MG/2ML IJ SOLN
INTRAMUSCULAR | Status: AC
  Filled 2022-10-25: qty 2

## 2022-10-25 MED ORDER — SODIUM CHLORIDE 0.9 % IV SOLN
2.0000 g | Freq: Two times a day (BID) | INTRAVENOUS | Status: AC
  Administered 2022-10-25: 2 g via INTRAVENOUS
  Filled 2022-10-25: qty 2

## 2022-10-25 MED ORDER — ALVIMOPAN 12 MG PO CAPS
12.0000 mg | ORAL_CAPSULE | ORAL | Status: AC
  Administered 2022-10-25: 12 mg via ORAL
  Filled 2022-10-25: qty 1

## 2022-10-25 MED ORDER — ACETAMINOPHEN 500 MG PO TABS: 1000.0000 mg | ORAL_TABLET | Freq: Once | ORAL | Status: DC

## 2022-10-25 MED ORDER — KCL IN DEXTROSE-NACL 20-5-0.45 MEQ/L-%-% IV SOLN: INTRAVENOUS | Status: DC

## 2022-10-25 MED ORDER — ESCITALOPRAM OXALATE 20 MG PO TABS
10.0000 mg | ORAL_TABLET | Freq: Every day | ORAL | Status: DC
  Administered 2022-10-26 – 2022-10-29 (×4): 10 mg via ORAL
  Filled 2022-10-25 (×4): qty 1

## 2022-10-25 MED ORDER — CHLORHEXIDINE GLUCONATE 4 % EX LIQD: 60.0000 mL | Freq: Once | CUTANEOUS | Status: DC

## 2022-10-25 MED ORDER — SODIUM CHLORIDE 0.9 % IV SOLN
INTRAVENOUS | Status: AC | PRN
  Administered 2022-10-25: 10 mL via INTRAMUSCULAR

## 2022-10-25 MED ORDER — LACTATED RINGERS IV SOLN: INTRAVENOUS | Status: DC

## 2022-10-25 MED ORDER — LIDOCAINE HCL 2 % IJ SOLN
INTRAMUSCULAR | Status: AC
  Filled 2022-10-25: qty 20

## 2022-10-25 MED ORDER — EPHEDRINE SULFATE-NACL 50-0.9 MG/10ML-% IV SOSY
PREFILLED_SYRINGE | INTRAVENOUS | Status: DC | PRN
  Administered 2022-10-25 (×2): 5 mg via INTRAVENOUS
  Administered 2022-10-25: 10 mg via INTRAVENOUS
  Administered 2022-10-25: 5 mg via INTRAVENOUS

## 2022-10-25 MED ORDER — FENTANYL CITRATE (PF) 250 MCG/5ML IJ SOLN
INTRAMUSCULAR | Status: DC | PRN
  Administered 2022-10-25: 100 ug via INTRAVENOUS
  Administered 2022-10-25 (×3): 50 ug via INTRAVENOUS

## 2022-10-25 MED ORDER — MIDAZOLAM HCL 5 MG/5ML IJ SOLN
INTRAMUSCULAR | Status: DC | PRN
  Administered 2022-10-25: 2 mg via INTRAVENOUS

## 2022-10-25 MED ORDER — HEPARIN SODIUM (PORCINE) 5000 UNIT/ML IJ SOLN
5000.0000 [IU] | Freq: Three times a day (TID) | INTRAMUSCULAR | Status: DC
  Administered 2022-10-25 – 2022-10-29 (×11): 5000 [IU] via SUBCUTANEOUS
  Filled 2022-10-25 (×11): qty 1

## 2022-10-25 MED ORDER — DEXAMETHASONE SODIUM PHOSPHATE 10 MG/ML IJ SOLN
INTRAMUSCULAR | Status: DC | PRN
  Administered 2022-10-25: 10 mg via INTRAVENOUS

## 2022-10-25 MED ORDER — POLYETHYLENE GLYCOL 3350 17 GM/SCOOP PO POWD: 1.0000 | Freq: Once | ORAL | Status: DC

## 2022-10-25 MED ORDER — BUPIVACAINE LIPOSOME 1.3 % IJ SUSP
INTRAMUSCULAR | Status: DC | PRN
  Administered 2022-10-25: 20 mL

## 2022-10-25 MED ORDER — NEOMYCIN SULFATE 500 MG PO TABS: 1000.0000 mg | ORAL_TABLET | ORAL | Status: DC

## 2022-10-25 MED ORDER — DEXAMETHASONE SODIUM PHOSPHATE 10 MG/ML IJ SOLN
INTRAMUSCULAR | Status: AC
  Filled 2022-10-25: qty 1

## 2022-10-25 MED ORDER — ALVIMOPAN 12 MG PO CAPS
12.0000 mg | ORAL_CAPSULE | Freq: Two times a day (BID) | ORAL | Status: DC
  Administered 2022-10-26 – 2022-10-27 (×2): 12 mg via ORAL
  Filled 2022-10-25 (×2): qty 1

## 2022-10-25 MED ORDER — HYDROMORPHONE HCL 1 MG/ML IJ SOLN
INTRAMUSCULAR | Status: AC
  Filled 2022-10-25: qty 1

## 2022-10-25 MED ORDER — ONDANSETRON HCL 4 MG/2ML IJ SOLN
INTRAMUSCULAR | Status: DC | PRN
  Administered 2022-10-25: 4 mg via INTRAVENOUS

## 2022-10-25 MED ORDER — BUPIVACAINE LIPOSOME 1.3 % IJ SUSP
INTRAMUSCULAR | Status: AC
  Filled 2022-10-25: qty 20

## 2022-10-25 MED ORDER — ORAL CARE MOUTH RINSE: 15.0000 mL | Freq: Once | OROMUCOSAL | Status: AC

## 2022-10-25 MED ORDER — ROCURONIUM BROMIDE 10 MG/ML (PF) SYRINGE
PREFILLED_SYRINGE | INTRAVENOUS | Status: DC | PRN
  Administered 2022-10-25: 70 mg via INTRAVENOUS
  Administered 2022-10-25 (×2): 10 mg via INTRAVENOUS
  Administered 2022-10-25: 20 mg via INTRAVENOUS

## 2022-10-25 MED ORDER — ENSURE PRE-SURGERY PO LIQD: 296.0000 mL | Freq: Once | ORAL | Status: DC

## 2022-10-25 MED ORDER — LIDOCAINE 2% (20 MG/ML) 5 ML SYRINGE
INTRAMUSCULAR | Status: DC | PRN
  Administered 2022-10-25: 100 mg via INTRAVENOUS

## 2022-10-25 MED ORDER — SUGAMMADEX SODIUM 200 MG/2ML IV SOLN
INTRAVENOUS | Status: DC | PRN
  Administered 2022-10-25: 200 mg via INTRAVENOUS

## 2022-10-25 MED ORDER — METRONIDAZOLE 500 MG PO TABS: 1000.0000 mg | ORAL_TABLET | ORAL | Status: DC

## 2022-10-25 MED ORDER — PHENYLEPHRINE 80 MCG/ML (10ML) SYRINGE FOR IV PUSH (FOR BLOOD PRESSURE SUPPORT)
PREFILLED_SYRINGE | INTRAVENOUS | Status: AC
  Filled 2022-10-25: qty 10

## 2022-10-25 MED ORDER — PROPOFOL 10 MG/ML IV BOLUS
INTRAVENOUS | Status: DC | PRN
  Administered 2022-10-25: 30 mg via INTRAVENOUS
  Administered 2022-10-25: 110 mg via INTRAVENOUS
  Administered 2022-10-25: 10 mg via INTRAVENOUS
  Administered 2022-10-25: 50 mg via INTRAVENOUS
  Administered 2022-10-25: 30 mg via INTRAVENOUS

## 2022-10-25 MED ORDER — MIDAZOLAM HCL 2 MG/2ML IJ SOLN
INTRAMUSCULAR | Status: AC
  Filled 2022-10-25: qty 2

## 2022-10-25 MED ORDER — HYDROMORPHONE HCL 1 MG/ML IJ SOLN
0.2500 mg | INTRAMUSCULAR | Status: DC | PRN
  Administered 2022-10-25 (×4): 0.5 mg via INTRAVENOUS

## 2022-10-25 MED ORDER — HEPARIN SODIUM (PORCINE) 5000 UNIT/ML IJ SOLN
5000.0000 [IU] | Freq: Once | INTRAMUSCULAR | Status: AC
  Administered 2022-10-25: 5000 [IU] via SUBCUTANEOUS
  Filled 2022-10-25: qty 1

## 2022-10-25 SURGICAL SUPPLY — 91 items
ADAPTER GOLDBERG URETERAL (ADAPTER) IMPLANT
ADPR CATH 15X14FR FL DRN BG (ADAPTER) ×1
APL PRP STRL LF DISP 70% ISPRP (MISCELLANEOUS) ×1
APPLIER CLIP 5 13 M/L LIGAMAX5 (MISCELLANEOUS)
APPLIER CLIP ROT 10 11.4 M/L (STAPLE)
APR CLP MED LRG 11.4X10 (STAPLE)
APR CLP MED LRG 5 ANG JAW (MISCELLANEOUS)
BAG COUNTER SPONGE SURGICOUNT (BAG) IMPLANT
BAG SPNG CNTER NS LX DISP (BAG)
BAG URO CATCHER STRL LF (MISCELLANEOUS) ×1 IMPLANT
BASKET ZERO TIP NITINOL 2.4FR (BASKET) IMPLANT
BLADE EXTENDED COATED 6.5IN (ELECTRODE) IMPLANT
BSKT STON RTRVL ZERO TP 2.4FR (BASKET)
CABLE HIGH FREQUENCY MONO STRZ (ELECTRODE) ×1 IMPLANT
CATH URETL OPEN END 6FR 70 (CATHETERS) IMPLANT
CELLS DAT CNTRL 66122 CELL SVR (MISCELLANEOUS) IMPLANT
CHLORAPREP W/TINT 26 (MISCELLANEOUS) ×1 IMPLANT
CLIP APPLIE 5 13 M/L LIGAMAX5 (MISCELLANEOUS) IMPLANT
CLIP APPLIE ROT 10 11.4 M/L (STAPLE) IMPLANT
CLOTH BEACON ORANGE TIMEOUT ST (SAFETY) ×1 IMPLANT
COUNTER NEEDLE 1200 MAGNETIC (NEEDLE) ×1 IMPLANT
COVER MAYO STAND STRL (DRAPES) ×3 IMPLANT
COVER SURGICAL LIGHT HANDLE (MISCELLANEOUS) ×1 IMPLANT
DRAPE LAPAROSCOPIC ABDOMINAL (DRAPES) ×1 IMPLANT
DRSG OPSITE POSTOP 4X10 (GAUZE/BANDAGES/DRESSINGS) IMPLANT
DRSG OPSITE POSTOP 4X6 (GAUZE/BANDAGES/DRESSINGS) IMPLANT
DRSG OPSITE POSTOP 4X8 (GAUZE/BANDAGES/DRESSINGS) IMPLANT
ELECT REM PT RETURN 15FT ADLT (MISCELLANEOUS) ×1 IMPLANT
GAUZE SPONGE 4X4 12PLY STRL (GAUZE/BANDAGES/DRESSINGS) IMPLANT
GLOVE SURG LX STRL 7.5 STRW (GLOVE) ×1 IMPLANT
GLOVE SURG LX STRL 8.0 MICRO (GLOVE) ×2 IMPLANT
GOWN STRL REUS W/ TWL XL LVL3 (GOWN DISPOSABLE) ×7 IMPLANT
GOWN STRL REUS W/TWL XL LVL3 (GOWN DISPOSABLE) ×7
GUIDEWIRE ANG ZIPWIRE 038X150 (WIRE) IMPLANT
GUIDEWIRE STR DUAL SENSOR (WIRE) ×1 IMPLANT
HANDLE STAPLE EGIA 4 XL (STAPLE) IMPLANT
IRRIG SUCT STRYKERFLOW 2 WTIP (MISCELLANEOUS) ×1
IRRIGATION SUCT STRKRFLW 2 WTP (MISCELLANEOUS) ×1 IMPLANT
KIT TURNOVER KIT A (KITS) IMPLANT
LEGGING LITHOTOMY PAIR STRL (DRAPES) IMPLANT
MANIFOLD NEPTUNE II (INSTRUMENTS) ×1 IMPLANT
PACK COLON (CUSTOM PROCEDURE TRAY) ×1 IMPLANT
PACK CYSTO (CUSTOM PROCEDURE TRAY) ×1 IMPLANT
PAD POSITIONING PINK XL (MISCELLANEOUS) ×1 IMPLANT
PENCIL SMOKE EVACUATOR (MISCELLANEOUS) IMPLANT
PROTECTOR NERVE ULNAR (MISCELLANEOUS) ×2 IMPLANT
RELOAD EGIA 60 MED/THCK PURPLE (STAPLE) ×1 IMPLANT
RELOAD PROXIMATE 75MM BLUE (ENDOMECHANICALS) IMPLANT
RELOAD STAPLE 60 MED/THCK ART (STAPLE) IMPLANT
RELOAD STAPLE 75 3.8 BLU REG (ENDOMECHANICALS) IMPLANT
RETRACTOR WND ALEXIS 18 MED (MISCELLANEOUS) IMPLANT
RTRCTR WOUND ALEXIS 18CM MED (MISCELLANEOUS)
SCISSORS LAP 5X45 EPIX DISP (ENDOMECHANICALS) ×1 IMPLANT
SET TUBE SMOKE EVAC HIGH FLOW (TUBING) ×1 IMPLANT
SHEARS HARMONIC ACE PLUS 45CM (MISCELLANEOUS) IMPLANT
SLEEVE Z-THREAD 5X100MM (TROCAR) ×2 IMPLANT
SPIKE FLUID TRANSFER (MISCELLANEOUS) ×1 IMPLANT
STAPLER CIRCULAR MANUAL XL 29 (STAPLE) IMPLANT
STAPLER CVD CUT BL 40 RELOAD (ENDOMECHANICALS) IMPLANT
STAPLER CVD CUT BLU 40 RELOAD (ENDOMECHANICALS) IMPLANT
STAPLER ECHELON POWER CIR 29 (STAPLE) IMPLANT
STAPLER ECHELON POWER CIR 31 (STAPLE) IMPLANT
STAPLER GUN LINEAR PROX 60 (STAPLE) IMPLANT
STAPLER PROXIMATE 75MM BLUE (STAPLE) IMPLANT
STAPLER VISISTAT 35W (STAPLE) ×1 IMPLANT
SUT CHROMIC 3 0 SH 27 (SUTURE) IMPLANT
SUT MNCRL AB 4-0 PS2 18 (SUTURE) IMPLANT
SUT NOVA NAB DX-16 0-1 5-0 T12 (SUTURE) IMPLANT
SUT PDS AB 1 TP1 96 (SUTURE) IMPLANT
SUT PDS AB 4-0 SH 27 (SUTURE) IMPLANT
SUT PROLENE 2 0 KS (SUTURE) IMPLANT
SUT PROLENE 2 0 SH DA (SUTURE) IMPLANT
SUT SILK 2 0 (SUTURE) ×1
SUT SILK 2 0 SH CR/8 (SUTURE) ×1 IMPLANT
SUT SILK 2-0 18XBRD TIE 12 (SUTURE) ×1 IMPLANT
SUT SILK 3 0 (SUTURE) ×1
SUT SILK 3 0 SH CR/8 (SUTURE) ×1 IMPLANT
SUT SILK 3-0 18XBRD TIE 12 (SUTURE) ×1 IMPLANT
SUT VIC AB 2-0 CT1 27 (SUTURE) ×1
SUT VIC AB 2-0 CT1 TAPERPNT 27 (SUTURE) IMPLANT
SUT VIC AB 4-0 SH 18 (SUTURE) ×1 IMPLANT
SYS LAPSCP GELPORT 120MM (MISCELLANEOUS)
SYS WOUND ALEXIS 18CM MED (MISCELLANEOUS) ×1
SYSTEM LAPSCP GELPORT 120MM (MISCELLANEOUS) IMPLANT
SYSTEM WOUND ALEXIS 18CM MED (MISCELLANEOUS) IMPLANT
TOWEL OR NON WOVEN STRL DISP B (DISPOSABLE) ×1 IMPLANT
TRAY FOLEY MTR SLVR 14FR STAT (SET/KITS/TRAYS/PACK) IMPLANT
TRAY FOLEY MTR SLVR 16FR STAT (SET/KITS/TRAYS/PACK) IMPLANT
TROCAR 11X100 Z THREAD (TROCAR) IMPLANT
TROCAR Z-THREAD OPTICAL 5X100M (TROCAR) ×1 IMPLANT
TUBING CONNECTING 10 (TUBING) ×1 IMPLANT

## 2022-10-25 NOTE — Op Note (Signed)
ANNAI DOEHRING  1960/10/23   10/25/2022    PCP:  Susy Frizzle, MD   Surgeon: Kaylyn Lim, MD, FACS  Asst:  Annye English, MD, FACS  Anes:  general  Preop Dx: Recurrent diverticulitis with stricture Postop Dx: same  Procedure: Laparoscopic assisted sigmoid colectomy with 29 EEA stapled anastomosis Location Surgery: [facility] 4 Complications: None noted  EBL:   minimal cc  Drains: none  Description of Procedure:  The patient was taken to OR 4 .  After anesthesia was administered and the patient was prepped  with chloroprep after stents were placed by Dr. Junious Silk  and a timeout was performed.  Access to the abdomen achieved with a 5 mm Optiview through the right upper quadrant.  5 mm was placed in the right lower quadrant left upper quadrant in the right lower midline.  Was in stirrups with her head down and tilted slightly to the right.  Using a harmonic scalpel began mobilizing the sigmoid colon which was stuck anteriorly and of thinly failed adhesion.  The colon could be mobilized to the descending colon fairly easily but was stuck in the mid sigmoid.  After mobilizing this with harmonic and sharp dissection reached a point where finger blunt dissection was necessary.  The midline incision for the trocar was extended about 9 to 10 cm and entered the abdomen with without difficulty and placed the protector.  Colon was grasped distally and worked proximally to this point of maximal obstruction.  We we could feel the stent on the left side and we stayed medial to that and I went ahead and mobilized this with blunt and sharp dissection to where it came up into the wound.  It was found areas below the confluence of the tenia and then proximally divided the bowel proximally distally with the Covidien GIA 6 cm endovascular stapler.  Mesentery was divided with the LigaSure and some vessels were oversewn with a figure-of-eight sutures of 3-0 silk.  This anatomy line itself to an end-to-end  anastomosis.  Were able to pass a 29 dilator from below to the anastomotic site which proved to be about 15 cm up.  We then used the 29 stapler and placed the anvil proximally we using a pursestring suture of 2-0 Prolene tying that down.  The stapler came to the midportion of the staple line and the stump and these were coupled closed carefully fired.  Endoscopy was performed to the level of the staple line and look good and no bubbles were noted.  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  Bleeding was noted at this point after irrigation to submerge the anastomosis and to evacuate that.  We changed the gown and gloves and read towel the area although I did leave the trocars visible.  I closed the peritoneum with a running 2-0 vicryl and the fascia was closed with interrupted #1 Novofils inverting the knots some of that were simple and some more figure-of-eight's.  Prior to doing this surgery we did a block Exparel on either side using a total of 30 cc.  When the fascia was closed reinsufflated the abdomen and in and examined the closure from was within an look to be good and there is no bowel or anything stuck to that area.  Abdomen was deflated.  The lower mid the middle midline incision was closed with a running subcuticular 4-0 Monocryl and then I placed some staples  intermittently and old glue between those and the 3 other trocar sites were closed with glue and a couple staples for hemostasis.  The Foley and the stents were removed.  Abdominal binder was applied the patient was moved to bed and taken recovery room.  The patient tolerated the procedure well and was taken to the PACU in stable condition.     Matt B. Hassell Done, [place], [facility], [place]

## 2022-10-25 NOTE — Anesthesia Postprocedure Evaluation (Signed)
Anesthesia Post Note  Patient: Barbara Bradford  Procedure(s) Performed: LAPAROSCOPIC ASSISTED SIGMOID COLECTOMY CYSTOSCOPY WITH STENT PLACEMENT     Patient location during evaluation: PACU Anesthesia Type: General Level of consciousness: awake and alert Pain management: pain level controlled Vital Signs Assessment: post-procedure vital signs reviewed and stable Respiratory status: spontaneous breathing, nonlabored ventilation and respiratory function stable Cardiovascular status: blood pressure returned to baseline Postop Assessment: no apparent nausea or vomiting Anesthetic complications: no   No notable events documented.  Last Vitals:  Vitals:   10/25/22 1245 10/25/22 1300  BP: 106/65 116/70  Pulse: 92 97  Resp: 15 15  Temp:    SpO2: 98% 98%    Last Pain:  Vitals:   10/25/22 1300  TempSrc:   PainSc: 2                  Marthenia Rolling

## 2022-10-25 NOTE — Op Note (Signed)
Preoperative diagnosis: Recurrent diverticulitis, sigmoid stricture Postoperative diagnosis: Same  Procedure: Cystoscopy with bilateral retrograde pyelogram, bilateral ureteral stent placement  Surgeon: Junious Silk  Anesthesia: General  Indication for procedure: Barbara Bradford is a 62 year old female with recurrent diverticulitis and sigmoid stricture undergoing lap assisted colectomy.  Procedure requested for ureteral identification.  Findings: The vulva appeared normal without lesion.  The meatus appeared normal.  The bladder and urethra were palpably normal.  On cystoscopy the bladder was unremarkable.  There was no sign of fistula.  Mucosa appeared normal without lesion or erythema.  No stone or foreign body.  The trigone and ureteral orifice ease appeared normal with clear E flux.  Right retrograde pyelogram-this outlined a single ureter single collecting system unit without filling defect, stricture or dilation.  Left retrograde pyelogram-this outlined a single ureter single collecting system unit without filling defect, stricture or dilation.  Description of procedure: After consent was obtained patient brought to the operating room.  After adequate anesthesia she is placed lithotomy position and prepped and draped in the usual sterile fashion.  Timeout was performed to correct the patient and procedure.  Exam was performed.  Cystoscope was passed per urethra the bladder carefully inspected.  A 5 French open-ended catheter was used to cannulate the right ureteral orifice and retrograde injection of contrast was performed.  I then passed a sensor wire up to the renal pelvis and the open-ended catheter over the wire leaving at about the level of the renal pelvis.  The wire was removed and the scope backed out leaving the stent in place.  The left ureteral orifice was cannulated with a 6 [place] open-ended catheter and left retrograde was performed.  The wire was passed up to the level of the renal  pelvis and the catheter passed over the wire up to the level of the left renal pelvis.  The wire was removed and the scope backed out.  A Foley catheter was placed and left to gravity drainage.  I then secured the catheters to the Foley with a 0 silk tie.  The adapter to the catheters and Foley were attached to the drainage bag.  She was then turned over to the care to Dr. Hassell Done.  Complications: None  Blood loss: Minimal  Specimens: None  Drains: Right 5 x 28 cm externalized ureteral catheter, left 6 x 28 cm externalized ureteral catheter, Foley catheter  Disposition: Patient turned over to the care of Dr. Hassell Done for his procedure.

## 2022-10-25 NOTE — Anesthesia Procedure Notes (Signed)
Procedure Name: Intubation Date/Time: 10/25/2022 7:45 AM  Performed by: Jenne Campus, CRNAPre-anesthesia Checklist: Patient identified, Emergency Drugs available, Suction available and Patient being monitored Patient Re-evaluated:Patient Re-evaluated prior to induction Oxygen Delivery Method: Circle System Utilized Preoxygenation: Pre-oxygenation with 100% oxygen Induction Type: IV induction Ventilation: Mask ventilation without difficulty Laryngoscope Size: Miller and 2 Grade View: Grade I Tube type: Oral Tube size: 7.0 mm Number of attempts: 1 Airway Equipment and Method: Stylet and Oral airway Placement Confirmation: ETT inserted through vocal cords under direct vision, positive ETCO2 and breath sounds checked- equal and bilateral Secured at: 21 cm Tube secured with: Tape Dental Injury: Teeth and Oropharynx as per pre-operative assessment

## 2022-10-25 NOTE — Interval H&P Note (Signed)
History and Physical Interval Note:  10/25/2022 7:07 AM  Barbara Bradford  has presented today for surgery, with the diagnosis of RECURRENT DIVERTICULITIS.  The various methods of treatment have been discussed with the patient and family. After consideration of risks, benefits and other options for treatment, the patient has consented to  Procedure(s): LAPAROSCOPIC ASSISTED SIGMOID COLECTOMY (N/A) CYSTOSCOPY WITH STENT PLACEMENT (N/A) as a surgical intervention.  The patient's history has been reviewed, patient examined, no change in status, stable for surgery.  I have reviewed the patient's chart and labs.  Questions were answered to the patient's satisfaction.     [name]

## 2022-10-25 NOTE — Transfer of Care (Signed)
Immediate Anesthesia Transfer of Care Note  Patient: Barbara Bradford  Procedure(s) Performed: LAPAROSCOPIC ASSISTED SIGMOID COLECTOMY CYSTOSCOPY WITH STENT PLACEMENT  Patient Location: PACU  Anesthesia Type:General  Level of Consciousness: awake, oriented, and patient cooperative  Airway & Oxygen Therapy: Patient Spontanous Breathing and Patient connected to face mask oxygen  Post-op Assessment: Report given to RN and Post -op Vital signs reviewed and stable  Post vital signs: Reviewed  Last Vitals:  Vitals Value Taken Time  BP 116/72 10/25/22 1132  Temp    Pulse 100 10/25/22 1133  Resp 18 10/25/22 1133  SpO2 100 % 10/25/22 1133  Vitals shown include unvalidated device data.  Last Pain:  Vitals:   10/25/22 0629  TempSrc:   PainSc: 0-No pain         Complications: No notable events documented.

## 2022-10-25 NOTE — Consult Note (Signed)
Consultation: Recurrent diverticulitis Requested by: Dr. Johnathan Hausen  History of Present Illness: Barbara Bradford is a 62 year old female with a history of recurrent diverticulitis and sigmoid stricture.  She is undergoing a lap assisted colectomy today.  Dr. Hassell Done requested urologic assistance with cystoscopy and bilateral ureteral stent placement for ureteral identification.  She is well.  She denies any dysuria or gross hematuria.  No prior urologic history.  CT scan February 2024 showed a benign GU tract.  Past Medical History:  Diagnosis Date   Abdominal bloating    Anxiety    Arthritis    Diverticulosis    history of   Gas    at times   Hx of abnormal cervical Pap smear    Osteopenia    Past Surgical History:  Procedure Laterality Date   ABDOMINAL HYSTERECTOMY     has part of one ovary   abdominaplasty  2000   AUGMENTATION MAMMAPLASTY Bilateral    implants removed a year ago   BREAST ENHANCEMENT SURGERY     CLAVICLE SURGERY     left side/had pins removed after a fracture   COLONOSCOPY     hx coloposcopy     REDUCTION MAMMAPLASTY Bilateral     Home Medications:  Medications Prior to Admission  Medication Sig Dispense Refill Last Dose   atorvastatin (LIPITOR) 10 MG tablet TAKE 1 TABLET(10 MG) BY MOUTH DAILY 90 tablet 3 10/24/2022   escitalopram (LEXAPRO) 10 MG tablet Take 1 tablet (10 mg total) by mouth daily. 90 tablet 3 10/25/2022 at 0400   zolpidem (AMBIEN) 5 MG tablet Take 1 tablet (5 mg total) by mouth at bedtime as needed for sleep. 90 tablet 3 10/24/2022   Allergies: Not on File  Family History  Problem Relation Age of Onset   Dementia Mother    Diabetes Mother    Stroke Father    Pancreatic cancer Sister    Social History:  reports that she quit smoking about 25 years ago. Her smoking use included cigarettes. She has never used smokeless tobacco. She reports current alcohol use of about 2.0 standard drinks of alcohol per week. She reports that she does not use  drugs.  ROS: A complete review of systems was performed.  All systems are negative except for pertinent findings as noted. Review of Systems  All other systems reviewed and are negative.    Physical Exam:  Vital signs in last 24 hours: Temp:  [97.9 F (36.6 C)] 97.9 F (36.6 C) (03/15 0541) Pulse Rate:  [87] 87 (03/15 0541) Resp:  [16] 16 (03/15 0541) BP: (144)/(87) 144/87 (03/15 0541) SpO2:  [96 %] 96 % (03/15 0541) Weight:  [75 kg] 75 kg (03/15 0629) General:  Alert and oriented, No acute distress HEENT: Normocephalic, atraumatic Cardiovascular: Regular rate and rhythm Lungs: Regular rate and effort Abdomen: Soft, nontender, nondistended, no abdominal masses Back: No CVA tenderness Extremities: No edema Neurologic: Grossly intact  Laboratory Data:  No results found for this or any previous visit (from the past 24 hour(s)). No results found for this or any previous visit (from the past 240 hour(s)). Creatinine: No results for input(s): "CREATININE" in the last 168 hours.  Impression/Assessment:  Recurrent diverticulitis, sigmoid colon stricture-  Plan:  I discussed with the patient the nature, potential benefits, risks and alternatives to cystoscopy with bilateral retrograde pyelogram, bilateral ureteral stent placement, including side effects of the proposed treatment, the likelihood of the patient achieving the goals of the procedure, and any potential problems that might  occur during the procedure or recuperation. All questions answered. Patient elects to proceed.    Festus Aloe 10/25/2022, 7:22 AM

## 2022-10-26 LAB — BASIC METABOLIC PANEL
Anion gap: 10 (ref 5–15)
BUN: 7 mg/dL (ref 6–20)
CO2: 25 mmol/L (ref 22–32)
Calcium: 8.4 mg/dL — ABNORMAL LOW (ref 8.9–10.3)
Chloride: 101 mmol/L (ref 98–111)
Creatinine, Ser: 0.79 mg/dL (ref 0.44–1.00)
GFR, Estimated: >60 mL/min (ref >60)
Glucose, Bld: 98 mg/dL (ref 70–99)
Potassium: 3.9 mmol/L (ref 3.5–5.1)
Sodium: 136 mmol/L (ref 135–145)

## 2022-10-26 LAB — HEMOGLOBIN A1C
Hgb A1c MFr Bld: 5.5 % (ref 4.8–5.6)
Mean Plasma Glucose: 111 mg/dL

## 2022-10-26 LAB — CBC
HCT: 35.6 % — ABNORMAL LOW (ref 36.0–46.0)
Hemoglobin: 11.5 g/dL — ABNORMAL LOW (ref 12.0–15.0)
MCH: 28.8 pg (ref 26.0–34.0)
MCHC: 32.3 g/dL (ref 30.0–36.0)
MCV: 89.2 fL (ref 80.0–100.0)
Platelets: 218 10*3/uL (ref 150–400)
RBC: 3.99 MIL/uL (ref 3.87–5.11)
RDW: 13.5 % (ref 11.5–15.5)
WBC: 7.7 10*3/uL (ref 4.0–10.5)
nRBC: 0 % (ref 0.0–0.2)

## 2022-10-26 MED ORDER — ATORVASTATIN CALCIUM 10 MG PO TABS
10.0000 mg | ORAL_TABLET | Freq: Every day | ORAL | Status: DC
  Administered 2022-10-26 – 2022-10-28 (×3): 10 mg via ORAL
  Filled 2022-10-26 (×3): qty 1

## 2022-10-26 MED ORDER — ZOLPIDEM TARTRATE 5 MG PO TABS
5.0000 mg | ORAL_TABLET | Freq: Every evening | ORAL | Status: DC | PRN
  Administered 2022-10-26 – 2022-10-28 (×3): 5 mg via ORAL
  Filled 2022-10-26 (×3): qty 1

## 2022-10-26 NOTE — Progress Notes (Cosign Needed)
  Transition of Care [facility]) Screening Note   Patient Details  Name: Barbara Bradford Date of Birth: March 15, 1961   Transition of Care [facility]) CM/SW Contact:    Henrietta Dine, RN Phone Number: 10/26/2022, 4:12 PM    Transition of Care Department [facility]) has reviewed patient and no TOC needs have been identified at this time. We will continue to monitor patient advancement through interdisciplinary progression rounds. If new patient transition needs arise, please place a TOC consult.

## 2022-10-26 NOTE — Progress Notes (Addendum)
MD Marcello Moores was paged because patient is asking for Lipitor 10mg  and Ambien 5mg  that she takes at night. Verbal order by MD Leighton Ruff to place order for Lipitor 10mg  and Ambien 5mg  PO.

## 2022-10-26 NOTE — Progress Notes (Signed)
1 Day Post-Op lap sigmoidectomy  Subjective: No acute issues this am, had some nausea yesterday, tolerating clears  Objective: Vital signs in last 24 hours: Temp:  [98 F (36.7 C)-98.8 F (37.1 C)] 98.6 F (37 C) (03/16 0623) Pulse Rate:  [58-98] 84 (03/16 0623) Resp:  [11-19] 18 (03/16 0623) BP: (104-144)/(59-78) 124/66 (03/16 0623) SpO2:  [92 %-100 %] 96 % (03/16 0623)   Intake/Output from previous day: 03/15 0701 - 03/16 0700 In: 3288.3 [P.O.:840; I.V.:2248.3; IV Piggyback:200] Out: [id number] [Urine:3325; Blood:200] Intake/Output this shift: No intake/output data recorded.   General appearance: alert and cooperative GI: normal findings: soft, nondistended  Incision: healing well, no significant drainage  Lab Results:  Recent Labs    10/25/22 1622 10/26/22 0458  WBC 11.3* 7.7  HGB 12.3 11.5*  HCT 38.3 35.6*  PLT 234 218   BMET Recent Labs    10/25/22 1622 10/26/22 0458  NA  --  136  K  --  3.9  CL  --  101  CO2  --  25  GLUCOSE  --  98  BUN  --  7  CREATININE 0.77 0.79  CALCIUM  --  8.4*   PT/INR No results for input(s): "LABPROT", "INR" in the last 72 hours. ABG No results for input(s): "PHART", "HCO3" in the last 72 hours.  Invalid input(s): "PCO2", "PO2"  MEDS, Scheduled  alvimopan  12 mg Oral BID   escitalopram  10 mg Oral Daily   feeding supplement  237 mL Oral BID BM   heparin injection (subcutaneous)  5,000 Units Subcutaneous Q8H    Studies/Results: DG C-Arm 1-60 Min-No Report  Result Date: 10/25/2022 Fluoroscopy was utilized by the requesting physician.  No radiographic interpretation.    Assessment: s/p Procedure(s): LAPAROSCOPIC ASSISTED SIGMOID COLECTOMY CYSTOSCOPY WITH STENT PLACEMENT Patient Active Problem List   Diagnosis Date Noted   S/P sigmoid colectomy for diverticulitis 10/25/2022   Rhinosinusitis 10/22/2022   Osteopenia    Diverticulosis 06/27/2018   Anxiety and depression 06/27/2018   Other abnormal glucose  06/27/2018   Hyperlipemia 04/21/2018   Overweight (BMI 25.0-29.9) 04/16/2018   Insomnia 04/16/2018   Constipation 07/10/2010    Expected post op course  Plan: d/c foley Advance diet to fulls as tolerated Ambulate in hall   LOS: 1 day     .Rosario Adie, MD [facility], [place]    10/26/2022 8:51 AM

## 2022-10-27 NOTE — Progress Notes (Signed)
2 Days Post-Op   Subjective/Chief Complaint: Still bloated and not much bowel function.  Had a small bowel movement.  Still pain is a mild issue   Objective: Vital signs in last 24 hours: Temp:  [98.2 F (36.8 C)-98.9 F (37.2 C)] 98.4 F (36.9 C) (03/17 0635) Pulse Rate:  [66-74] 74 (03/17 0635) Resp:  [14-18] 18 (03/17 0635) BP: (125-139)/(70-73) 139/71 (03/17 0635) SpO2:  [94 %-97 %] 94 % (03/17 0635) Last BM Date : 10/27/22  Intake/Output from previous day: 03/16 0701 - 03/17 0700 In: 1260 [P.O.:1260] Out: 1100 [Urine:1100] Intake/Output this shift: No intake/output data recorded.  Resp: clear to auscultation bilaterally Cardio: Normal sinus Incision/Wound: Dressing intact.  Minimal drainage.  Nondistended soft.  Binder in place.  Lab Results:  Recent Labs    10/25/22 1622 10/26/22 0458  WBC 11.3* 7.7  HGB 12.3 11.5*  HCT 38.3 35.6*  PLT 234 218   BMET Recent Labs    10/25/22 1622 10/26/22 0458  NA  --  136  K  --  3.9  CL  --  101  CO2  --  25  GLUCOSE  --  98  BUN  --  7  CREATININE 0.77 0.79  CALCIUM  --  8.4*   PT/INR No results for input(s): "LABPROT", "INR" in the last 72 hours. ABG No results for input(s): "PHART", "HCO3" in the last 72 hours.  Invalid input(s): "PCO2", "PO2"  Studies/Results: No results found.  Anti-infectives: Anti-infectives (From admission, onward)    Start     Dose/Rate Route Frequency Ordered Stop   10/25/22 2000  cefoTEtan (CEFOTAN) 2 g in sodium chloride 0.9 % 100 mL IVPB        2 g 200 mL/hr over 30 Minutes Intravenous Every 12 hours 10/25/22 1613 10/25/22 2015   10/25/22 1400  neomycin (MYCIFRADIN) tablet 1,000 mg  Status:  Discontinued       See Hyperspace for full Linked Orders Report.   1,000 mg Oral 3 times per day 10/25/22 0543 10/25/22 0549   10/25/22 1400  metroNIDAZOLE (FLAGYL) tablet 1,000 mg  Status:  Discontinued       See Hyperspace for full Linked Orders Report.   1,000 mg Oral 3 times per  day 10/25/22 0543 10/25/22 0549   10/25/22 0600  cefoTEtan (CEFOTAN) 2 g in sodium chloride 0.9 % 100 mL IVPB        2 g 200 mL/hr over 30 Minutes Intravenous On call to O.R. 10/25/22 0543 10/25/22 0752       Assessment/Plan: s/p Procedure(s): LAPAROSCOPIC ASSISTED SIGMOID COLECTOMY (N/A) CYSTOSCOPY WITH STENT PLACEMENT (N/A) Continue ambulation  Leave on full liquids for now until she has more bowel function    LOS: 2 days    Turner Daniels MD 10/27/2022

## 2022-10-28 ENCOUNTER — Encounter (HOSPITAL_COMMUNITY): Payer: Self-pay | Admitting: Surgery

## 2022-10-28 LAB — SURGICAL PATHOLOGY

## 2022-10-28 NOTE — Progress Notes (Signed)
Patient ID: Barbara Bradford, female   DOB: August 07, 1961, 62 y.o.   MRN: [id number] [facility] Progress Note:   3 Days Post-Op   THE PLAN  Assess PO intake with advancement in the am  Subjective: Mental status is clear.  Complaints cramping pain yesterday. Objective: Vital signs in last 24 hours: Temp:  [98 F (36.7 C)-98.6 F (37 C)] 98.6 F (37 C) (03/18 1323) Pulse Rate:  [66-76] 68 (03/18 1323) Resp:  [14-18] 14 (03/18 1323) BP: (120-135)/(71-75) 131/71 (03/18 1323) SpO2:  [96 %] 96 % (03/18 1323)  Intake/Output from previous day: 03/17 0701 - 03/18 0700 In: 1040 [P.O.:1040] Out: -  Intake/Output this shift: Total I/O In: 240 [P.O.:240] Out: -   Physical Exam: Work of breathing is normal.  Abdomen is flat  Lab Results:  No results found for this or any previous visit (from the past 48 hour(s)).  Radiology/Results: No results found.  Anti-infectives: Anti-infectives (From admission, onward)    Start     Dose/Rate Route Frequency Ordered Stop   10/25/22 2000  cefoTEtan (CEFOTAN) 2 g in sodium chloride 0.9 % 100 mL IVPB        2 g 200 mL/hr over 30 Minutes Intravenous Every 12 hours 10/25/22 1613 10/25/22 2015   10/25/22 1400  neomycin (MYCIFRADIN) tablet 1,000 mg  Status:  Discontinued       See Hyperspace for full Linked Orders Report.   1,000 mg Oral 3 times per day 10/25/22 0543 10/25/22 0549   10/25/22 1400  metroNIDAZOLE (FLAGYL) tablet 1,000 mg  Status:  Discontinued       See Hyperspace for full Linked Orders Report.   1,000 mg Oral 3 times per day 10/25/22 0543 10/25/22 0549   10/25/22 0600  cefoTEtan (CEFOTAN) 2 g in sodium chloride 0.9 % 100 mL IVPB        2 g 200 mL/hr over 30 Minutes Intravenous On call to O.R. 10/25/22 0543 10/25/22 0752       Assessment/Plan: Problem List: Patient Active Problem List   Diagnosis Date Noted   S/P sigmoid colectomy for diverticulitis 10/25/2022   Rhinosinusitis 10/22/2022   Osteopenia     Diverticulosis 06/27/2018   Anxiety and depression 06/27/2018   Other abnormal glucose 06/27/2018   Hyperlipemia 04/21/2018   Overweight (BMI 25.0-29.9) 04/16/2018   Insomnia 04/16/2018   Constipation 07/10/2010    Doing well.  Postop ileus clearing.   3 Days Post-Op    LOS: 3 days   Matt B. Hassell Done, MD, [facility] [phone] to reach the surgeon on call.    10/28/2022 3:49 PM

## 2022-10-29 ENCOUNTER — Other Ambulatory Visit (HOSPITAL_COMMUNITY): Payer: Self-pay

## 2022-10-29 MED ORDER — HYDROCODONE-ACETAMINOPHEN 5-325 MG PO TABS
1.0000 | ORAL_TABLET | Freq: Four times a day (QID) | ORAL | 0 refills | Status: DC | PRN
  Filled 2022-10-29: qty 15, 4d supply, fill #0

## 2022-10-29 NOTE — Progress Notes (Signed)
Reviewed written D/C instructions with patient and all questions answered. Pt verbalized understanding. Pt left with all belongings, meds from med to bed, and in stable condition

## 2022-10-29 NOTE — Discharge Summary (Signed)
Physician Discharge Summary  Patient ID: Barbara Bradford MRN: [id number] DOB/AGE: 03-04-1961 62 y.o.  PCP: Susy Frizzle, MD  Admit date: 10/25/2022 Discharge date: 10/29/2022  Admission Diagnoses:  diverticular stricture  Discharge Diagnoses:  perforated diverticulitis with diverticulitis and stricture  Principal Problem:   S/P sigmoid colectomy for diverticulitis   Surgery:  lap assisted sigmoid colectomy  Discharged Condition: improved  Hospital Course:   Had surgery on Friday.  Begun on liquids over the weekend and advanced to soft diet on Tuesday at which time she was ready for discharge.  Staples were removed at the time of discharge  Consults: none  Significant Diagnostic Studies: path     Discharge Exam: Blood pressure 136/89, pulse 73, temperature 98 F (36.7 C), temperature source Oral, resp. rate 18, height 5\' 6"  (1.676 m), weight 75 kg, SpO2 94 %. Incisions bland  Disposition: Discharge disposition: 01-Home or Self Care       Discharge Instructions     Call MD for:  redness, tenderness, or signs of infection (pain, swelling, redness, odor or green/yellow discharge around incision site)   Complete by: As directed    Diet - low sodium heart healthy   Complete by: As directed    Discharge instructions   Complete by: As directed    May shower tomorrow Advance diet slowly   Increase activity slowly   Complete by: As directed       Allergies as of 10/29/2022   Not on File      Medication List     TAKE these medications    atorvastatin 10 MG tablet Commonly known as: LIPITOR TAKE 1 TABLET(10 MG) BY MOUTH DAILY   escitalopram 10 MG tablet Commonly known as: Lexapro Take 1 tablet (10 mg total) by mouth daily.   HYDROcodone-acetaminophen 5-325 MG tablet Commonly known as: NORCO/VICODIN Take 1 tablet by mouth every 6 (six) hours as needed for moderate pain.   zolpidem 5 MG tablet Commonly known as: AMBIEN Take 1 tablet (5 mg total)  by mouth at bedtime as needed for sleep.        Follow-up Information     Johnathan Hausen, MD Follow up in 3 week(s).   Specialty: General Surgery Contact information: [place] [address] [phone]                 Signed: [name] 10/29/2022, 11:07 AM

## 2022-10-30 ENCOUNTER — Telehealth: Payer: Self-pay

## 2022-10-30 NOTE — Transitions of Care (Post Inpatient/ED Visit) (Signed)
   10/30/2022  Name: Barbara Bradford MRN: [id number] DOB: 12-Oct-1960  Today's TOC FU Call Status: Today's TOC FU Call Status:: Successful TOC FU Call Competed TOC FU Call Complete Date: 10/30/22  Transition Care Management Follow-up Telephone Call Date of Discharge: 10/29/22 Discharge Facility: [facility]) Type of Discharge: Inpatient Admission How have you been since you were released from the hospital?: Better Any questions or concerns?: No  Items Reviewed: Did you receive and understand the discharge instructions provided?: Yes Medications obtained and verified?: Yes (Medications Reviewed) Any new allergies since your discharge?: No Dietary orders reviewed?: Yes Do you have support at home?: Yes People in Home: spouse  Home Care and Equipment/Supplies: [place] Ordered?: NA Any new equipment or medical supplies ordered?: NA  Functional Questionnaire: Do you need assistance with bathing/showering or dressing?: No Do you need assistance with meal preparation?: No Do you need assistance with eating?: No Do you have difficulty maintaining continence: No Do you need assistance with getting out of bed/getting out of a chair/moving?: No Do you have difficulty managing or taking your medications?: No  Follow up appointments reviewed: PCP Follow-up appointment confirmed?: [facility] Follow-up appointment confirmed?: Yes Date of Specialist follow-up appointment?: 11/19/22 Follow-Up Specialty Provider:: Dr Hassell Done Do you need transportation to your follow-up appointment?: No Do you understand care options if your condition(s) worsen?: Yes-patient verbalized understanding    [place], [place] Nurse Health Advisor Direct Dial [phone]

## 2022-11-19 ENCOUNTER — Other Ambulatory Visit: Payer: Self-pay | Admitting: Family Medicine

## 2022-11-19 DIAGNOSIS — K5900 Constipation, unspecified: Secondary | ICD-10-CM

## 2022-12-30 ENCOUNTER — Other Ambulatory Visit: Payer: Self-pay | Admitting: Family Medicine

## 2022-12-30 DIAGNOSIS — G4700 Insomnia, unspecified: Secondary | ICD-10-CM

## 2022-12-31 NOTE — Telephone Encounter (Signed)
Requested medication (s) are due for refill today - no  Requested medication (s) are on the active medication list -yes  Future visit scheduled -no  Last refill: 06/25/22 #90 3RF  Notes to clinic: non delegated Rx  Requested Prescriptions  Pending Prescriptions Disp Refills   zolpidem (AMBIEN) 5 MG tablet [Pharmacy Med Name: ZOLPIDEM 5MG  TABLETS] 90 tablet     Sig: TAKE 1 TABLET(5 MG) BY MOUTH AT BEDTIME AS NEEDED FOR SLEEP     Not Delegated - Psychiatry:  Anxiolytics/Hypnotics Failed - 12/30/2022  8:34 AM      Failed - This refill cannot be delegated      Failed - Urine Drug Screen completed in last 360 days      Failed - Valid encounter within last 6 months    Recent Outpatient Visits           1 year ago Perforation of right tympanic membrane   [facility] Pickard, Priscille Heidelberg, MD   1 year ago Need for immunization against influenza   [facility] Donita Brooks, MD   2 years ago Acute bacterial rhinosinusitis   [facility] Tanya Nones, Priscille Heidelberg, MD   2 years ago Pleurisy   [facility] Tanya Nones, Priscille Heidelberg, MD   2 years ago Dislocation of temporomandibular joint, initial encounter   [facility] Elmore Guise, FNP                 Requested Prescriptions  Pending Prescriptions Disp Refills   zolpidem (AMBIEN) 5 MG tablet [Pharmacy Med Name: ZOLPIDEM 5MG  TABLETS] 90 tablet     Sig: TAKE 1 TABLET(5 MG) BY MOUTH AT BEDTIME AS NEEDED FOR SLEEP     Not Delegated - Psychiatry:  Anxiolytics/Hypnotics Failed - 12/30/2022  8:34 AM      Failed - This refill cannot be delegated      Failed - Urine Drug Screen completed in last 360 days      Failed - Valid encounter within last 6 months    Recent Outpatient Visits           1 year ago Perforation of right tympanic membrane   [facility] Tanya Nones, Priscille Heidelberg, MD   1 year ago Need for immunization against influenza   [facility] Donita Brooks, MD   2 years ago Acute bacterial rhinosinusitis   [facility] Tanya Nones, Priscille Heidelberg, MD   2 years ago Pleurisy   [facility] Tanya Nones, Priscille Heidelberg, MD   2 years ago Dislocation of temporomandibular joint, initial encounter   [facility] Elmore Guise, FNP

## 2023-03-31 ENCOUNTER — Other Ambulatory Visit: Payer: Self-pay | Admitting: Family Medicine

## 2023-03-31 DIAGNOSIS — G4700 Insomnia, unspecified: Secondary | ICD-10-CM

## 2023-04-01 NOTE — Telephone Encounter (Signed)
Requested medication (s) are due for refill today - yes  Requested medication (s) are on the active medication list -yes  Future visit scheduled -no  Last refill: 12/31/22 #90  Notes to clinic: non delegated Rx  Requested Prescriptions  Pending Prescriptions Disp Refills   zolpidem (AMBIEN) 5 MG tablet [Pharmacy Med Name: ZOLPIDEM 5MG  TABLETS] 90 tablet     Sig: TAKE 1 TABLET(5 MG) BY MOUTH AT BEDTIME AS NEEDED FOR SLEEP     Not Delegated - Psychiatry:  Anxiolytics/Hypnotics Failed - 03/31/2023  5:17 PM      Failed - This refill cannot be delegated      Failed - Urine Drug Screen completed in last 360 days      Failed - Valid encounter within last 6 months    Recent Outpatient Visits           1 year ago Perforation of right tympanic membrane   [facility] Pickard, Priscille Heidelberg, MD   1 year ago Need for immunization against influenza   [facility] Donita Brooks, MD   2 years ago Acute bacterial rhinosinusitis   [facility] Tanya Nones, Priscille Heidelberg, MD   2 years ago Pleurisy   [facility] Tanya Nones, Priscille Heidelberg, MD   3 years ago Dislocation of temporomandibular joint, initial encounter   [facility] Elmore Guise, FNP                 Requested Prescriptions  Pending Prescriptions Disp Refills   zolpidem (AMBIEN) 5 MG tablet [Pharmacy Med Name: ZOLPIDEM 5MG  TABLETS] 90 tablet     Sig: TAKE 1 TABLET(5 MG) BY MOUTH AT BEDTIME AS NEEDED FOR SLEEP     Not Delegated - Psychiatry:  Anxiolytics/Hypnotics Failed - 03/31/2023  5:17 PM      Failed - This refill cannot be delegated      Failed - Urine Drug Screen completed in last 360 days      Failed - Valid encounter within last 6 months    Recent Outpatient Visits           1 year ago Perforation of right tympanic membrane   [facility] Tanya Nones, Priscille Heidelberg, MD   1 year ago Need for immunization against influenza   [facility] Donita Brooks, MD   2 years ago Acute bacterial rhinosinusitis   [facility] Tanya Nones, Priscille Heidelberg, MD   2 years ago Pleurisy   [facility] Tanya Nones Priscille Heidelberg, MD   3 years ago Dislocation of temporomandibular joint, initial encounter   [facility] Elmore Guise, FNP

## 2023-04-07 ENCOUNTER — Encounter: Payer: Self-pay | Admitting: Family Medicine

## 2023-04-07 ENCOUNTER — Other Ambulatory Visit: Payer: Self-pay | Admitting: Family Medicine

## 2023-04-07 DIAGNOSIS — G4700 Insomnia, unspecified: Secondary | ICD-10-CM

## 2023-04-07 MED ORDER — ZOLPIDEM TARTRATE 5 MG PO TABS: 5.0000 mg | ORAL_TABLET | Freq: Every evening | ORAL | 0 refills | Status: DC | PRN

## 2023-04-24 ENCOUNTER — Ambulatory Visit: Payer: No Typology Code available for payment source | Admitting: Family Medicine

## 2023-05-01 ENCOUNTER — Ambulatory Visit: Payer: No Typology Code available for payment source | Admitting: Family Medicine

## 2023-05-01 VITALS — BP 122/82 | HR 72 | Temp 97.6°F | Ht 66.0 in | Wt 164.6 lb

## 2023-05-01 DIAGNOSIS — F988 Other specified behavioral and emotional disorders with onset usually occurring in childhood and adolescence: Secondary | ICD-10-CM

## 2023-05-01 DIAGNOSIS — R911 Solitary pulmonary nodule: Secondary | ICD-10-CM

## 2023-05-01 MED ORDER — LINACLOTIDE 145 MCG PO CAPS: 145.0000 ug | ORAL_CAPSULE | Freq: Every day | ORAL | 3 refills | Status: DC

## 2023-05-01 NOTE — Progress Notes (Signed)
Subjective:    Patient ID: Barbara Bradford, female    DOB: 24-Aug-1960, 62 y.o.   MRN: [id number]  HPI Patient had a CT scan in February that showed a pulmonary nodule in the right lower lobe that was approximately 2 cm in diameter.  They recommended a repeat CT scan in 3 to 6 months.  I placed an order for this however the patient has not heard about scheduling this.  Therefore she came in today to get this scheduled.  She also reports trouble with focusing.  Her brother has a history of ADD.  The patient works for [employer].  She is on the phone constantly for work.  She finds herself easily distracted often staring out the window.  People will ask questions and she will find that she has not listened to them on the telephone and has no idea what they are referring to.  She also finds herself starting multiple tasks and becoming distracted abandoning most task doing something else entirely.  This is starting to negatively impact her work as she is unable to focus during meetings, listening to clients, answer questions.  She tries her cell to use distracted having a hard time listening.  She denies any hyperactivity or agitation.  She denies any depression.  She denies any short-term memory loss. Past Medical History:  Diagnosis Date   Abdominal bloating    Anxiety    Arthritis    Diverticulosis    history of   Gas    at times   Hx of abnormal cervical Pap smear    Osteopenia    Past Surgical History:  Procedure Laterality Date   ABDOMINAL HYSTERECTOMY     has part of one ovary   abdominaplasty  2000   AUGMENTATION MAMMAPLASTY Bilateral    implants removed a year ago   BREAST ENHANCEMENT SURGERY     CLAVICLE SURGERY     left side/had pins removed after a fracture   COLONOSCOPY     CYSTOSCOPY WITH STENT PLACEMENT N/A 10/25/2022   Procedure: CYSTOSCOPY WITH STENT PLACEMENT;  Surgeon: Jerilee Field, MD;  Location: [facility];  Service: Urology;  Laterality: N/A;   hx coloposcopy      LAPAROSCOPIC SIGMOID COLECTOMY N/A 10/25/2022   Procedure: LAPAROSCOPIC ASSISTED SIGMOID COLECTOMY;  Surgeon: Luretha Smitherman, MD;  Location: [facility];  Service: General;  Laterality: N/A;   REDUCTION MAMMAPLASTY Bilateral    Current Outpatient Medications on File Prior to Visit  Medication Sig Dispense Refill   atorvastatin (LIPITOR) 10 MG tablet TAKE 1 TABLET(10 MG) BY MOUTH DAILY 90 tablet 3   escitalopram (LEXAPRO) 10 MG tablet Take 1 tablet (10 mg total) by mouth daily. 90 tablet 3   HYDROcodone-acetaminophen (NORCO/VICODIN) 5-325 MG tablet Take 1 tablet by mouth every 6 (six) hours as needed for moderate pain. 15 tablet 0   zolpidem (AMBIEN) 5 MG tablet Take 1 tablet (5 mg total) by mouth at bedtime as needed for sleep. 90 tablet 0   No current facility-administered medications on file prior to visit.   Not on File Social History   Socioeconomic History   Marital status: Married    Spouse name: Not on file   Number of children: 2   Years of education: Not on file   Highest education level: 12th grade  Occupational History   Occupation: Production designer, theatre/television/film  Tobacco Use   Smoking status: Former    Current packs/day: 0.00    Types: Cigarettes    Quit date:  04/16/1997    Years since quitting: 26.0   Smokeless tobacco: Never  Vaping Use   Vaping status: Never Used  Substance and Sexual Activity   Alcohol use: Yes    Alcohol/week: 2.0 standard drinks of alcohol    Types: 2 Cans of beer per week    Comment: on ocassion   Drug use: No   Sexual activity: Yes    Partners: Male    Birth control/protection: None, Post-menopausal  Other Topics Concern   Not on file  Social History Narrative   Not on file   Social Determinants of Health   Financial Resource Strain: Low Risk  (04/28/2023)   Overall Financial Resource Strain (CARDIA)    Difficulty of Paying Living Expenses: Not hard at all  Food Insecurity: No Food Insecurity (04/28/2023)   Hunger Vital Sign    Worried About Running Out of  Food in the Last Year: Never true    Ran Out of Food in the Last Year: Never true  Transportation Needs: No Transportation Needs (04/28/2023)   PRAPARE - Administrator, Civil Service (Medical): No    Lack of Transportation (Non-Medical): No  Physical Activity: Insufficiently Active (04/28/2023)   Exercise Vital Sign    Days of Exercise per Week: 3 days    Minutes of Exercise per Session: 40 min  Stress: Stress Concern Present (04/28/2023)   [employer] of Occupational Health - Occupational Stress Questionnaire    Feeling of Stress : To some extent  Social Connections: Moderately Integrated (04/28/2023)   Social Connection and Isolation Panel [NHANES]    Frequency of Communication with Friends and Family: More than three times a week    Frequency of Social Gatherings with Friends and Family: Twice a week    Attends Religious Services: More than 4 times per year    Active Member of [employer] or Organizations: No    Attends Banker Meetings: Not on file    Marital Status: Living with partner  Intimate Partner Violence: Not At Risk (10/25/2022)   Humiliation, Afraid, Rape, and Kick questionnaire    Fear of Current or Ex-Partner: No    Emotionally Abused: No    Physically Abused: No    Sexually Abused: No      Review of Systems  All other systems reviewed and are negative.      Objective:   Physical Exam Vitals reviewed.  Constitutional:      Appearance: Normal appearance.  Cardiovascular:     Rate and Rhythm: Normal rate and regular rhythm.     Heart sounds: Normal heart sounds. No murmur heard. Pulmonary:     Effort: Pulmonary effort is normal.     Breath sounds: Normal breath sounds. No wheezing, rhonchi or rales.  Neurological:     Mental Status: She is alert.           Assessment & Plan:  Lung nodule - Plan: CT CHEST NODULE FOLLOW UP LOW DOSE W/O  Attention deficit disorder (ADD) without hyperactivity I have placed the order to repeat  CT scan of the lung to reassess the nodule in the right lower lobe regarding her symptoms, I believe that she is dealing with attention deficit disorder.  It is starting to negatively impact her career performance.  This has been present now for more than 6 months.  We discussed options and she would like to try Vyvanse but she will check to make sure that her insurance covers this first.

## 2023-05-05 ENCOUNTER — Encounter: Payer: Self-pay | Admitting: Family Medicine

## 2023-05-05 ENCOUNTER — Other Ambulatory Visit: Payer: Self-pay | Admitting: Family Medicine

## 2023-05-05 MED ORDER — AMPHETAMINE-DEXTROAMPHET ER 20 MG PO CP24: 20.0000 mg | ORAL_CAPSULE | Freq: Every day | ORAL | 0 refills | Status: DC

## 2023-05-23 ENCOUNTER — Other Ambulatory Visit: Payer: Self-pay | Admitting: Family Medicine

## 2023-05-23 MED ORDER — AMPHETAMINE-DEXTROAMPHET ER 20 MG PO CP24: 20.0000 mg | ORAL_CAPSULE | Freq: Every day | ORAL | 0 refills | Status: DC

## 2023-06-10 ENCOUNTER — Other Ambulatory Visit: Payer: Self-pay | Admitting: Family Medicine

## 2023-06-10 DIAGNOSIS — E785 Hyperlipidemia, unspecified: Secondary | ICD-10-CM

## 2023-06-11 NOTE — Telephone Encounter (Signed)
Requested Prescriptions  Pending Prescriptions Disp Refills   atorvastatin (LIPITOR) 10 MG tablet [Pharmacy Med Name: ATORVASTATIN 10MG  TABLETS] 90 tablet 0    Sig: TAKE 1 TABLET(10 MG) BY MOUTH DAILY     Cardiovascular:  Antilipid - Statins Failed - 06/10/2023  4:20 PM      Failed - Valid encounter within last 12 months    Recent Outpatient Visits           1 year ago Perforation of right tympanic membrane   [facility] Pickard, Priscille Heidelberg, MD   2 years ago Need for immunization against influenza   [facility] Donita Brooks, MD   2 years ago Acute bacterial rhinosinusitis   [facility] Tanya Nones, Priscille Heidelberg, MD   2 years ago Pleurisy   [facility] Tanya Nones, Priscille Heidelberg, MD   3 years ago Dislocation of temporomandibular joint, initial encounter   [facility] Jenne Pane, Crystal A, FNP              Failed - Lipid Panel in normal range within the last 12 months    Cholesterol  Date Value Ref Range Status  06/25/2022 197 <200 mg/dL Final   LDL Cholesterol (Calc)  Date Value Ref Range Status  06/25/2022 100 (H) mg/dL (calc) Final    Comment:    Reference range: <100 . Desirable range <100 mg/dL for primary prevention;   <70 mg/dL for patients with CHD or diabetic patients  with > or = 2 CHD risk factors. Marland Kitchen LDL-C is now calculated using the Martin-Hopkins  calculation, which is a validated novel method providing  better accuracy than the Friedewald equation in the  estimation of LDL-C.  Horald Pollen et al. Lenox Ahr. 5638;756(43): 2061-2068  (http://education.[facility].com/faq/FAQ164)    HDL  Date Value Ref Range Status  06/25/2022 83 > OR = 50 mg/dL Final   Triglycerides  Date Value Ref Range Status  06/25/2022 56 <150 mg/dL Final         Passed - Patient is not pregnant

## 2023-07-07 ENCOUNTER — Other Ambulatory Visit: Payer: Self-pay | Admitting: Family Medicine

## 2023-07-08 ENCOUNTER — Other Ambulatory Visit: Payer: Self-pay | Admitting: Family Medicine

## 2023-07-08 DIAGNOSIS — E785 Hyperlipidemia, unspecified: Secondary | ICD-10-CM

## 2023-07-08 DIAGNOSIS — G4700 Insomnia, unspecified: Secondary | ICD-10-CM

## 2023-07-08 NOTE — Telephone Encounter (Signed)
Requested Prescriptions  Pending Prescriptions Disp Refills   escitalopram (LEXAPRO) 10 MG tablet [Pharmacy Med Name: ESCITALOPRAM 10MG  TABLETS] 90 tablet 1    Sig: TAKE 1 TABLET(10 MG) BY MOUTH DAILY     Psychiatry:  Antidepressants - SSRI Failed - 07/07/2023  4:18 AM      Failed - Valid encounter within last 6 months    Recent Outpatient Visits           1 year ago Perforation of right tympanic membrane   [facility] Pickard, Priscille Heidelberg, MD   2 years ago Need for immunization against influenza   [facility] Donita Brooks, MD   2 years ago Acute bacterial rhinosinusitis   [facility] Tanya Nones, Priscille Heidelberg, MD   2 years ago Pleurisy   [facility] Tanya Nones Priscille Heidelberg, MD   3 years ago Dislocation of temporomandibular joint, initial encounter   [facility] Lawson Fiscal A, FNP              Passed - Completed PHQ-2 or PHQ-9 in the last 360 days

## 2023-07-08 NOTE — Telephone Encounter (Signed)
Needs appt

## 2023-07-09 ENCOUNTER — Encounter: Payer: Self-pay | Admitting: Family Medicine

## 2023-07-09 ENCOUNTER — Telehealth: Payer: Self-pay

## 2023-07-09 ENCOUNTER — Other Ambulatory Visit (HOSPITAL_BASED_OUTPATIENT_CLINIC_OR_DEPARTMENT_OTHER): Payer: Self-pay

## 2023-07-09 NOTE — Telephone Encounter (Signed)
Copied from CRM [id number]. Topic: Clinical - Medication Refill >> Jul 09, 2023  3:03 PM Amy B wrote: Most Recent Primary Care Visit:  Provider: Lynnea Ferrier T  Department: [facility]  Visit Type: SAME DAY  Date: 05/01/2023  Medication: Zolpidem.    Has the patient contacted their pharmacy? Yes (Agent: If no, request that the patient contact the pharmacy for the refill. If patient does not wish to contact the pharmacy document the reason why and proceed with request.) (Agent: If yes, when and what did the pharmacy advise?)  Is this the correct pharmacy for this prescription? Yes If no, delete pharmacy and type the correct one.  This is the patient's preferred pharmacy:  [facility] [phone] - [place], [place] - [address] AT [name] [address] [place] [address] Phone: [phone] Fax: [phone]  [facility] [place] [facility] [address] [place] [address] Phone: [phone] Fax: [phone]   Has the prescription been filled recently? No  Is the patient out of the medication? Yes  Has the patient been seen for an appointment in the last year OR does the patient have an upcoming appointment? Yes  Can we respond through MyChart? Yes  Agent: Please be advised that Rx refills may take up to 3 business days. We ask that you follow-up with your pharmacy.

## 2023-07-09 NOTE — Telephone Encounter (Signed)
Requested medication (s) are due for refill today: yes  Requested medication (s) are on the active medication list: yes  Last refill:  04/07/23 #90   Future visit scheduled: no  Notes to clinic:  not delegated to NT to RF   Requested Prescriptions  Pending Prescriptions Disp Refills   zolpidem (AMBIEN) 5 MG tablet [Pharmacy Med Name: ZOLPIDEM 5MG  TABLETS] 90 tablet     Sig: TAKE 1 TABLET(5 MG) BY MOUTH AT BEDTIME AS NEEDED FOR SLEEP     Not Delegated - Psychiatry:  Anxiolytics/Hypnotics Failed - 07/08/2023  8:01 PM      Failed - This refill cannot be delegated      Failed - Urine Drug Screen completed in last 360 days      Failed - Valid encounter within last 6 months    Recent Outpatient Visits           1 year ago Perforation of right tympanic membrane   [facility] Tanya Nones, Priscille Heidelberg, MD   2 years ago Need for immunization against influenza   [facility] Donita Brooks, MD   2 years ago Acute bacterial rhinosinusitis   [facility] Tanya Nones, Priscille Heidelberg, MD   2 years ago Pleurisy   [facility] Tanya Nones, Priscille Heidelberg, MD   3 years ago Dislocation of temporomandibular joint, initial encounter   [facility] Jenne Pane, Rocky Crafts, FNP              Refused Prescriptions Disp Refills   traZODone (DESYREL) 50 MG tablet [Pharmacy Med Name: TRAZODONE 50MG  TABLETS] 30 tablet     Sig: TAKE 1 TABLET(50 MG) BY MOUTH AT BEDTIME AS NEEDED FOR SLEEP     Psychiatry: Antidepressants - Serotonin Modulator Failed - 07/08/2023  8:01 PM      Failed - Valid encounter within last 6 months    Recent Outpatient Visits           1 year ago Perforation of right tympanic membrane   [facility] Pickard, Priscille Heidelberg, MD   2 years ago Need for immunization against influenza   [facility] Donita Brooks, MD   2 years ago Acute bacterial rhinosinusitis   [facility]  Tanya Nones, Priscille Heidelberg, MD   2 years ago Pleurisy   [facility] Tanya Nones, Priscille Heidelberg, MD   3 years ago Dislocation of temporomandibular joint, initial encounter   [facility] Jenne Pane, Crystal A, FNP              Passed - Completed PHQ-2 or PHQ-9 in the last 360 days       atorvastatin (LIPITOR) 10 MG tablet [Pharmacy Med Name: ATORVASTATIN 10MG  TABLETS] 90 tablet     Sig: TAKE 1 TABLET(10 MG) BY MOUTH DAILY     Cardiovascular:  Antilipid - Statins Failed - 07/08/2023  8:01 PM      Failed - Valid encounter within last 12 months    Recent Outpatient Visits           1 year ago Perforation of right tympanic membrane   [facility] Pickard, Priscille Heidelberg, MD   2 years ago Need for immunization against influenza   [facility] Donita Brooks, MD   2 years ago Acute bacterial rhinosinusitis   [facility] Tanya Nones, Priscille Heidelberg, MD   2 years ago Pleurisy   [facility] Pickard, Priscille Heidelberg, MD   3 years ago Dislocation of temporomandibular joint, initial encounter   [facility] Lawson Fiscal A, FNP              Failed - Lipid Panel in normal range within the last 12 months    Cholesterol  Date Value Ref Range Status  06/25/2022 197 <200 mg/dL Final   LDL Cholesterol (Calc)  Date Value Ref Range Status  06/25/2022 100 (H) mg/dL (calc) Final    Comment:    Reference range: <100 . Desirable range <100 mg/dL for primary prevention;   <70 mg/dL for patients with CHD or diabetic patients  with > or = 2 CHD risk factors. Marland Kitchen LDL-C is now calculated using the Martin-Hopkins  calculation, which is a validated novel method providing  better accuracy than the Friedewald equation in the  estimation of LDL-C.  Horald Pollen et al. Lenox Ahr. 1610;960(45): 2061-2068  (http://education.[facility].com/faq/FAQ164)    HDL  Date Value Ref Range Status  06/25/2022 83 > OR = 50 mg/dL Final    Triglycerides  Date Value Ref Range Status  06/25/2022 56 <150 mg/dL Final         Passed - Patient is not pregnant

## 2023-07-09 NOTE — Telephone Encounter (Signed)
Requested Prescriptions  Pending Prescriptions Disp Refills   zolpidem (AMBIEN) 5 MG tablet [Pharmacy Med Name: ZOLPIDEM 5MG  TABLETS] 90 tablet     Sig: TAKE 1 TABLET(5 MG) BY MOUTH AT BEDTIME AS NEEDED FOR SLEEP     Not Delegated - Psychiatry:  Anxiolytics/Hypnotics Failed - 07/08/2023  8:01 PM      Failed - This refill cannot be delegated      Failed - Urine Drug Screen completed in last 360 days      Failed - Valid encounter within last 6 months    Recent Outpatient Visits           1 year ago Perforation of right tympanic membrane   [facility] Tanya Nones, Priscille Heidelberg, MD   2 years ago Need for immunization against influenza   [facility] Donita Brooks, MD   2 years ago Acute bacterial rhinosinusitis   [facility] Tanya Nones, Priscille Heidelberg, MD   2 years ago Pleurisy   [facility] Tanya Nones, Priscille Heidelberg, MD   3 years ago Dislocation of temporomandibular joint, initial encounter   [facility] Jenne Pane, Rocky Crafts, FNP               traZODone (DESYREL) 50 MG tablet [Pharmacy Med Name: TRAZODONE 50MG  TABLETS] 30 tablet     Sig: TAKE 1 TABLET(50 MG) BY MOUTH AT BEDTIME AS NEEDED FOR SLEEP     Psychiatry: Antidepressants - Serotonin Modulator Failed - 07/08/2023  8:01 PM      Failed - Valid encounter within last 6 months    Recent Outpatient Visits           1 year ago Perforation of right tympanic membrane   [facility] Pickard, Priscille Heidelberg, MD   2 years ago Need for immunization against influenza   [facility] Donita Brooks, MD   2 years ago Acute bacterial rhinosinusitis   [facility] Tanya Nones, Priscille Heidelberg, MD   2 years ago Pleurisy   [facility] Tanya Nones, Priscille Heidelberg, MD   3 years ago Dislocation of temporomandibular joint, initial encounter   [facility] Jenne Pane, Rocky Crafts, FNP              Passed - Completed PHQ-2 or  PHQ-9 in the last 360 days       atorvastatin (LIPITOR) 10 MG tablet [Pharmacy Med Name: ATORVASTATIN 10MG  TABLETS] 90 tablet     Sig: TAKE 1 TABLET(10 MG) BY MOUTH DAILY     Cardiovascular:  Antilipid - Statins Failed - 07/08/2023  8:01 PM      Failed - Valid encounter within last 12 months    Recent Outpatient Visits           1 year ago Perforation of right tympanic membrane   [facility] Pickard, Priscille Heidelberg, MD   2 years ago Need for immunization against influenza   [facility] Donita Brooks, MD   2 years ago Acute bacterial rhinosinusitis   [facility] Tanya Nones, Priscille Heidelberg, MD   2 years ago Pleurisy   [facility] Tanya Nones, Priscille Heidelberg, MD   3 years ago Dislocation of temporomandibular joint, initial encounter   [facility] Jenne Pane, Crystal A, FNP              Failed - Lipid Panel in normal range within the  last 12 months    Cholesterol  Date Value Ref Range Status  06/25/2022 197 <200 mg/dL Final   LDL Cholesterol (Calc)  Date Value Ref Range Status  06/25/2022 100 (H) mg/dL (calc) Final    Comment:    Reference range: <100 . Desirable range <100 mg/dL for primary prevention;   <70 mg/dL for patients with CHD or diabetic patients  with > or = 2 CHD risk factors. Marland Kitchen LDL-C is now calculated using the Martin-Hopkins  calculation, which is a validated novel method providing  better accuracy than the Friedewald equation in the  estimation of LDL-C.  Horald Pollen et al. Lenox Ahr. 9604;540(98): 2061-2068  (http://education.[facility].com/faq/FAQ164)    HDL  Date Value Ref Range Status  06/25/2022 83 > OR = 50 mg/dL Final   Triglycerides  Date Value Ref Range Status  06/25/2022 56 <150 mg/dL Final         Passed - Patient is not pregnant

## 2023-07-09 NOTE — Telephone Encounter (Signed)
Copied from CRM [id number]. Topic: Clinical - Prescription Issue >> Jul 09, 2023  9:44 AM Clayton Bibles wrote: Reason for CRM: [facility] is waiting on an approval for Zolpidem. Barbara Bradford is going out of town early tomorrow morning and needs refill ASAP. Please call with questions. PT is out of med.

## 2023-07-10 ENCOUNTER — Other Ambulatory Visit: Payer: Self-pay | Admitting: Family Medicine

## 2023-07-10 DIAGNOSIS — G4700 Insomnia, unspecified: Secondary | ICD-10-CM

## 2023-07-10 MED ORDER — ZOLPIDEM TARTRATE 5 MG PO TABS
5.0000 mg | ORAL_TABLET | Freq: Every evening | ORAL | 0 refills | Status: DC | PRN
Start: 2023-07-10 — End: 2023-09-29

## 2023-07-10 MED ORDER — AMPHETAMINE-DEXTROAMPHET ER 20 MG PO CP24
20.0000 mg | ORAL_CAPSULE | Freq: Every day | ORAL | 0 refills | Status: DC
Start: 2023-07-10 — End: 2023-09-29
  Filled 2023-07-10: qty 30, 30d supply, fill #0

## 2023-07-11 ENCOUNTER — Other Ambulatory Visit (HOSPITAL_BASED_OUTPATIENT_CLINIC_OR_DEPARTMENT_OTHER): Payer: Self-pay

## 2023-09-08 ENCOUNTER — Other Ambulatory Visit: Payer: Self-pay | Admitting: Family Medicine

## 2023-09-08 DIAGNOSIS — E785 Hyperlipidemia, unspecified: Secondary | ICD-10-CM

## 2023-09-29 ENCOUNTER — Encounter: Payer: Self-pay | Admitting: Family Medicine

## 2023-09-29 ENCOUNTER — Ambulatory Visit (INDEPENDENT_AMBULATORY_CARE_PROVIDER_SITE_OTHER): Payer: No Typology Code available for payment source | Admitting: Family Medicine

## 2023-09-29 VITALS — BP 140/84 | HR 65 | Temp 97.8°F | Resp 16 | Wt 170.0 lb

## 2023-09-29 DIAGNOSIS — G4700 Insomnia, unspecified: Secondary | ICD-10-CM

## 2023-09-29 DIAGNOSIS — Z780 Asymptomatic menopausal state: Secondary | ICD-10-CM

## 2023-09-29 DIAGNOSIS — Z0000 Encounter for general adult medical examination without abnormal findings: Secondary | ICD-10-CM

## 2023-09-29 DIAGNOSIS — E785 Hyperlipidemia, unspecified: Secondary | ICD-10-CM

## 2023-09-29 DIAGNOSIS — Z0001 Encounter for general adult medical examination with abnormal findings: Secondary | ICD-10-CM | POA: Diagnosis not present

## 2023-09-29 DIAGNOSIS — R911 Solitary pulmonary nodule: Secondary | ICD-10-CM

## 2023-09-29 DIAGNOSIS — Z23 Encounter for immunization: Secondary | ICD-10-CM

## 2023-09-29 DIAGNOSIS — Z1231 Encounter for screening mammogram for malignant neoplasm of breast: Secondary | ICD-10-CM

## 2023-09-29 MED ORDER — ESCITALOPRAM OXALATE 10 MG PO TABS: 10.0000 mg | ORAL_TABLET | Freq: Every day | ORAL | 3 refills | Status: DC

## 2023-09-29 MED ORDER — ATORVASTATIN CALCIUM 10 MG PO TABS
ORAL_TABLET | ORAL | 3 refills | Status: DC
Start: 2023-09-29 — End: 2024-02-20

## 2023-09-29 MED ORDER — ZOLPIDEM TARTRATE 5 MG PO TABS: 5.0000 mg | ORAL_TABLET | Freq: Every evening | ORAL | 0 refills | Status: DC | PRN

## 2023-09-29 MED ORDER — LINACLOTIDE 145 MCG PO CAPS: 145.0000 ug | ORAL_CAPSULE | Freq: Every day | ORAL | 3 refills | Status: DC

## 2023-09-29 NOTE — Addendum Note (Signed)
 Addended by: Renee Pain on: 09/29/2023 09:03 AM   Modules accepted: Orders

## 2023-09-29 NOTE — Progress Notes (Signed)
 Subjective:    Patient ID: Barbara Bradford, female    DOB: Sep 18, 1960, 63 y.o.   MRN: [id number]  HPI Patient is a 63 year old Caucasian female who presents today for complete physical exam.  Her last mammogram appears to be August 2023.  There was negative.  Her last colonoscopy was 2023 and due to the presence of 2 polyps they recommended a repeat colonoscopy in 7 years.  She gets her Pap smear through her gynecologist.  She is due for a bone density test.  Last bone density test was 5 years ago and she did have osteopenia.  On a CAT scan last year she was found to have a right-sided pulmonary nodule.  She is due to repeat CT scan to check for progression.  We ordered this previously however due to conflicts in her schedule she was unable to get the CT scan performed.  She is due for the pneumonia vaccine.  She is due for shingles vaccine.  She is also concerned about weight loss.  Patient has gained weight due to stress and caring for her mother with dementia. Past Medical History:  Diagnosis Date   Abdominal bloating    Anxiety    Arthritis    Diverticulosis    history of   Gas    at times   Hx of abnormal cervical Pap smear    Osteopenia    Past Surgical History:  Procedure Laterality Date   ABDOMINAL HYSTERECTOMY     has part of one ovary   abdominaplasty  2000   AUGMENTATION MAMMAPLASTY Bilateral    implants removed a year ago   BREAST ENHANCEMENT SURGERY     CLAVICLE SURGERY     left side/had pins removed after a fracture   COLONOSCOPY     CYSTOSCOPY WITH STENT PLACEMENT N/A 10/25/2022   Procedure: CYSTOSCOPY WITH STENT PLACEMENT;  Surgeon: Jerilee Field, MD;  Location: [facility];  Service: Urology;  Laterality: N/A;   hx coloposcopy     LAPAROSCOPIC SIGMOID COLECTOMY N/A 10/25/2022   Procedure: LAPAROSCOPIC ASSISTED SIGMOID COLECTOMY;  Surgeon: Luretha Manso, MD;  Location: [facility];  Service: General;  Laterality: N/A;   REDUCTION MAMMAPLASTY Bilateral    Current  Outpatient Medications on File Prior to Visit  Medication Sig Dispense Refill   amphetamine-dextroamphetamine (ADDERALL XR) 20 MG 24 hr capsule Take 1 capsule (20 mg total) by mouth daily. 90 capsule 0   atorvastatin (LIPITOR) 10 MG tablet TAKE 1 TABLET(10 MG) BY MOUTH DAILY 90 tablet 0   escitalopram (LEXAPRO) 10 MG tablet TAKE 1 TABLET(10 MG) BY MOUTH DAILY 90 tablet 1   HYDROcodone-acetaminophen (NORCO/VICODIN) 5-325 MG tablet Take 1 tablet by mouth every 6 (six) hours as needed for moderate pain. 15 tablet 0   linaclotide (LINZESS) 145 MCG CAPS capsule Take 1 capsule (145 mcg total) by mouth daily before breakfast. 90 capsule 3   zolpidem (AMBIEN) 5 MG tablet Take 1 tablet (5 mg total) by mouth at bedtime as needed for sleep. 90 tablet 0   No current facility-administered medications on file prior to visit.   Not on File Social History   Socioeconomic History   Marital status: Married    Spouse name: Not on file   Number of children: 2   Years of education: Not on file   Highest education level: 12th grade  Occupational History   Occupation: Production designer, theatre/television/film  Tobacco Use   Smoking status: Former    Current packs/day: 0.00    Types:  Cigarettes    Quit date: 04/16/1997    Years since quitting: 26.4   Smokeless tobacco: Never  Vaping Use   Vaping status: Never Used  Substance and Sexual Activity   Alcohol use: Yes    Alcohol/week: 2.0 standard drinks of alcohol    Types: 2 Cans of beer per week    Comment: on ocassion   Drug use: No   Sexual activity: Yes    Partners: Male    Birth control/protection: None, Post-menopausal  Other Topics Concern   Not on file  Social History Narrative   Not on file   Social Drivers of Health   Financial Resource Strain: Low Risk  (09/29/2023)   Overall Financial Resource Strain (CARDIA)    Difficulty of Paying Living Expenses: Not hard at all  Food Insecurity: No Food Insecurity (09/29/2023)   Hunger Vital Sign    Worried About Running Out of  Food in the Last Year: Never true    Ran Out of Food in the Last Year: Never true  Transportation Needs: No Transportation Needs (09/29/2023)   PRAPARE - Administrator, Civil Service (Medical): No    Lack of Transportation (Non-Medical): No  Physical Activity: Insufficiently Active (09/29/2023)   Exercise Vital Sign    Days of Exercise per Week: 4 days    Minutes of Exercise per Session: 30 min  Stress: Stress Concern Present (09/29/2023)   [employer] of Occupational Health - Occupational Stress Questionnaire    Feeling of Stress : To some extent  Social Connections: Socially Integrated (09/29/2023)   Social Connection and Isolation Panel [NHANES]    Frequency of Communication with Friends and Family: More than three times a week    Frequency of Social Gatherings with Friends and Family: More than three times a week    Attends Religious Services: 1 to 4 times per year    Active Member of [employer] or Organizations: Yes    Attends Banker Meetings: More than 4 times per year    Marital Status: Living with partner  Intimate Partner Violence: Not At Risk (10/25/2022)   Humiliation, Afraid, Rape, and Kick questionnaire    Fear of Current or Ex-Partner: No    Emotionally Abused: No    Physically Abused: No    Sexually Abused: No      Review of Systems  All other systems reviewed and are negative.      Objective:   Physical Exam Vitals reviewed.  Constitutional:      General: She is not in acute distress.    Appearance: Normal appearance. She is normal weight. She is not ill-appearing, toxic-appearing or diaphoretic.  HENT:     Head: Normocephalic and atraumatic.     Right Ear: Tympanic membrane and ear canal normal.     Left Ear: Tympanic membrane and ear canal normal.     Nose: Nose normal. No congestion or rhinorrhea.     Mouth/Throat:     Mouth: Mucous membranes are moist.     Pharynx: No oropharyngeal exudate or posterior oropharyngeal erythema.   Cardiovascular:     Rate and Rhythm: Normal rate and regular rhythm.     Pulses: Normal pulses.     Heart sounds: Normal heart sounds. No murmur heard.    No friction rub. No gallop.  Pulmonary:     Effort: Pulmonary effort is normal. No respiratory distress.     Breath sounds: Normal breath sounds. No wheezing, rhonchi or rales.  Chest:  Chest wall: No tenderness.  Abdominal:     General: Abdomen is flat. Bowel sounds are normal.     Palpations: Abdomen is soft.  Neurological:     Mental Status: She is alert.           Assessment & Plan:  Encounter for screening mammogram for malignant neoplasm of breast - Plan: MM Digital Screening  Postmenopausal estrogen deficiency - Plan: DG Bone Density  Lung nodule - Plan: CT CHEST LCS NODULE F/U LOW DOSE WO CONTRAST  Hyperlipidemia, unspecified hyperlipidemia type - Plan: CBC with Differential/Platelet, COMPLETE METABOLIC PANEL WITH GFR, Lipid panel  General medical exam Blood pressure is borderline.  Will monitor this.  I will schedule the patient for mammogram as well as a bone density.  Colonoscopy is due in 2030.  Schedule patient for CAT scan to repeat imaging of right lung nodule.  Check CBC, CMP, and lipid panel.  Patient received a pneumonia vaccine and shingles vaccine today.

## 2023-09-30 LAB — COMPLETE METABOLIC PANEL WITH GFR
AG Ratio: 1.3 (calc) (ref 1.0–2.5)
ALT: 14 U/L (ref 6–29)
AST: 14 U/L (ref 10–35)
Albumin: 3.9 g/dL (ref 3.6–5.1)
Alkaline phosphatase (APISO): 78 U/L (ref 37–153)
BUN: 17 mg/dL (ref 7–25)
CO2: 29 mmol/L (ref 20–32)
Calcium: 8.9 mg/dL (ref 8.6–10.4)
Chloride: 104 mmol/L (ref 98–110)
Creat: 0.66 mg/dL (ref 0.50–1.05)
Globulin: 3 g/dL (ref 1.9–3.7)
Glucose, Bld: 95 mg/dL (ref 65–99)
Potassium: 4.5 mmol/L (ref 3.5–5.3)
Sodium: 141 mmol/L (ref 135–146)
Total Bilirubin: 0.4 mg/dL (ref 0.2–1.2)
Total Protein: 6.9 g/dL (ref 6.1–8.1)
eGFR: 100 mL/min/{1.73_m2} (ref >=60)

## 2023-09-30 LAB — CBC WITH DIFFERENTIAL/PLATELET
Absolute Lymphocytes: 1518 {cells}/uL (ref 850–3900)
Absolute Monocytes: 621 {cells}/uL (ref 200–950)
Basophils Absolute: 29 {cells}/uL (ref 0–200)
Basophils Relative: 0.4 %
Eosinophils Absolute: 73 {cells}/uL (ref 15–500)
Eosinophils Relative: 1 %
HCT: 39.6 % (ref 35.0–45.0)
Hemoglobin: 12.7 g/dL (ref 11.7–15.5)
MCH: 28.5 pg (ref 27.0–33.0)
MCHC: 32.1 g/dL (ref 32.0–36.0)
MCV: 89 fL (ref 80.0–100.0)
MPV: 9.1 fL (ref 7.5–12.5)
Monocytes Relative: 8.5 %
Neutro Abs: 5059 {cells}/uL (ref 1500–7800)
Neutrophils Relative %: 69.3 %
Platelets: 390 10*3/uL (ref 140–400)
RBC: 4.45 10*6/uL (ref 3.80–5.10)
RDW: 12 % (ref 11.0–15.0)
Total Lymphocyte: 20.8 %
WBC: 7.3 10*3/uL (ref 3.8–10.8)

## 2023-09-30 LAB — LIPID PANEL
Cholesterol: 188 mg/dL (ref <200)
HDL: 79 mg/dL (ref >=50)
LDL Cholesterol (Calc): 92 mg/dL
Non-HDL Cholesterol (Calc): 109 mg/dL (ref <130)
Total CHOL/HDL Ratio: 2.4 (calc) (ref <5.0)
Triglycerides: 77 mg/dL (ref <150)

## 2023-10-01 NOTE — Telephone Encounter (Unsigned)
 Copied from CRM [id number]. Topic: Clinical - Request for Lab/Test Order >> Oct 01, 2023  3:19 PM Gildardo Pounds wrote: Reason for CRM: Collette with Centralized Scheduling said the CT needs to changed chest CT or chest nodule. Current orders is for smokers less that 15 years and patient hasn't smoked in 20 years. Callback number [phone]

## 2023-10-02 ENCOUNTER — Other Ambulatory Visit: Payer: Self-pay

## 2023-10-02 ENCOUNTER — Other Ambulatory Visit: Payer: Self-pay | Admitting: Family Medicine

## 2023-10-02 DIAGNOSIS — R911 Solitary pulmonary nodule: Secondary | ICD-10-CM

## 2023-10-03 ENCOUNTER — Other Ambulatory Visit: Payer: Self-pay | Admitting: Family Medicine

## 2023-10-03 DIAGNOSIS — R911 Solitary pulmonary nodule: Secondary | ICD-10-CM

## 2023-10-03 NOTE — Telephone Encounter (Signed)
 Called spoke w/pt, aware Rx for semaglutide  2.5mg /ml injection has been fax to [facility].

## 2023-10-08 ENCOUNTER — Other Ambulatory Visit (HOSPITAL_COMMUNITY): Payer: No Typology Code available for payment source

## 2023-10-08 ENCOUNTER — Ambulatory Visit (HOSPITAL_COMMUNITY)
Admission: RE | Admit: 2023-10-08 | Discharge: 2023-10-08 | Disposition: A | Discharge status: Home / Self Care | Payer: No Typology Code available for payment source | Source: Ambulatory Visit | Attending: Family Medicine | Admitting: Family Medicine

## 2023-10-08 ENCOUNTER — Ambulatory Visit (HOSPITAL_COMMUNITY): Payer: No Typology Code available for payment source

## 2023-10-08 DIAGNOSIS — Z780 Asymptomatic menopausal state: Secondary | ICD-10-CM | POA: Insufficient documentation

## 2023-10-08 DIAGNOSIS — Z1231 Encounter for screening mammogram for malignant neoplasm of breast: Secondary | ICD-10-CM | POA: Insufficient documentation

## 2023-10-08 DIAGNOSIS — R911 Solitary pulmonary nodule: Secondary | ICD-10-CM | POA: Insufficient documentation

## 2023-10-09 ENCOUNTER — Other Ambulatory Visit: Payer: Self-pay

## 2023-10-09 MED ORDER — ALENDRONATE SODIUM 70 MG PO TABS
70.0000 mg | ORAL_TABLET | ORAL | 0 refills | Status: AC
Start: 2023-10-09 — End: ?

## 2023-10-13 ENCOUNTER — Other Ambulatory Visit: Payer: Self-pay | Admitting: Family Medicine

## 2023-10-13 DIAGNOSIS — G4700 Insomnia, unspecified: Secondary | ICD-10-CM

## 2023-10-14 ENCOUNTER — Encounter: Payer: Self-pay | Admitting: Family Medicine

## 2023-10-20 ENCOUNTER — Ambulatory Visit: Payer: Self-pay | Admitting: Family Medicine

## 2023-10-20 ENCOUNTER — Telehealth: Payer: Self-pay

## 2023-10-20 NOTE — Telephone Encounter (Signed)
 STAT CT results called in from [facility].   "Nodular consolidation with surrounding ground-glass and peribronchovascular nodularity in the posterolateral right lower lobe, possibly infectious/inflammatory in etiology. Malignancy cannot be excluded. Follow-up CT chest without contrast in 3-4 weeks, after appropriate therapy, is recommended."  [facility] called and spoke to [facility] LPN. STAT CT results reported.    Copied from CRM [id number]. Topic: Clinical - Request for Lab/Test Order >> Oct 20, 2023 12:08 PM Turkey B wrote: Reason for CRM: caller from [facility] radiology, stat results Reason for Disposition  Lab or radiology calling with CRITICAL test results  Answer Assessment - Initial Assessment Questions 1. REASON FOR CALL or QUESTION: "What is your reason for calling today?" or "How can I best help you?" or "What question do you have that I can help answer?"     Results of STAT CT called from [facility] by Okey Regal. Results called into [facility] to [facility] LPN. 2. CALLER: Document the source of call. (e.g., laboratory, patient).     [facility].  Protocols used: PCP Call - No Triage-A-AH

## 2023-10-20 NOTE — Telephone Encounter (Signed)
 Copied from CRM [id number]. Topic: Clinical - Lab/Test Results >> Oct 20, 2023  3:50 PM Patsy Lager T wrote: Reason for CRM: patient needs an explanation of her imaging results. Please f/u with patient

## 2023-10-21 ENCOUNTER — Other Ambulatory Visit: Payer: Self-pay

## 2023-10-21 DIAGNOSIS — R911 Solitary pulmonary nodule: Secondary | ICD-10-CM

## 2023-11-07 ENCOUNTER — Ambulatory Visit: Admitting: Pulmonary Disease

## 2023-12-10 ENCOUNTER — Encounter: Payer: Self-pay | Admitting: Internal Medicine

## 2023-12-10 ENCOUNTER — Ambulatory Visit: Admitting: Internal Medicine

## 2023-12-10 VITALS — BP 110/70 | HR 65 | Temp 97.8°F | Ht 65.0 in | Wt 163.8 lb

## 2023-12-10 DIAGNOSIS — R918 Other nonspecific abnormal finding of lung field: Secondary | ICD-10-CM

## 2023-12-10 DIAGNOSIS — Z87891 Personal history of nicotine dependence: Secondary | ICD-10-CM

## 2023-12-10 DIAGNOSIS — R9389 Abnormal findings on diagnostic imaging of other specified body structures: Secondary | ICD-10-CM

## 2023-12-10 NOTE — Patient Instructions (Signed)
 Plan to Obtain PET scan within 24-48 hrs (this will be based on insurance company)  Avoid Allergens and Irritants Avoid secondhand smoke Avoid SICK contacts Recommend  Masking  when appropriate Recommend Keep up-to-date with vaccinations

## 2023-12-10 NOTE — Progress Notes (Signed)
 [facility] [place] Pulmonary Medicine Consultation      Date: 12/10/2023,   MRN# [id number] Barbara Bradford Dec 09, 1960     CHIEF COMPLAINT:   Assessment of abnormal CT chest    HISTORY OF PRESENT ILLNESS   63 year old pleasant white female seen today for assessment of abnormal CT chest Patient with a history of sigmoid colectomy 1 year ago Subsequent follow-up CT scans of the abdomen pelvis was obtained Subsequently CT of the chest was obtained in February 2025  CT scanning chest findings reviewed in detail with patient Patient with 2 abnormal findings in the right lung Patient was diagnosed with COVID several years ago  No exacerbation at this time No evidence of heart failure at this time No evidence or signs of infection at this time No respiratory distress No fevers, chills, nausea, vomiting, diarrhea No evidence of lower extremity edema No evidence hemoptysis  No significant shortness of breath No significant wheezing No significant weight loss No significant respiratory symptoms at this time Patient is a non-smoker Occasional alcohol use Works at home for [employer] consolidation with surrounding ground-glass and peribronchovascular nodularity in the posterolateral right lower Lobe, inflammatory process or malignancy is a possibility    Bronchiectasis, volume loss and architectural distortion in the posteromedial right lower lobe  unchanged from 03/18/2022. Associated mucoid impaction in the adjacent right lower lobe.  Chronic in nature previous history of COVID    PAST MEDICAL HISTORY   Past Medical History:  Diagnosis Date   Abdominal bloating    Anxiety    Arthritis    Diverticulosis    history of   Gas    at times   Hx of abnormal cervical Pap smear    Osteopenia      SURGICAL HISTORY   Past Surgical History:  Procedure Laterality Date   ABDOMINAL HYSTERECTOMY     has part of one ovary   abdominaplasty  2000    AUGMENTATION MAMMAPLASTY Bilateral    implants removed a year ago   BREAST ENHANCEMENT SURGERY     CLAVICLE SURGERY     left side/had pins removed after a fracture   COLONOSCOPY     CYSTOSCOPY WITH STENT PLACEMENT N/A 10/25/2022   Procedure: CYSTOSCOPY WITH STENT PLACEMENT;  Surgeon: Christina Coyer, MD;  Location: [facility];  Service: Urology;  Laterality: N/A;   hx coloposcopy     LAPAROSCOPIC SIGMOID COLECTOMY N/A 10/25/2022   Procedure: LAPAROSCOPIC ASSISTED SIGMOID COLECTOMY;  Surgeon: Jacolyn Matar, MD;  Location: [facility];  Service: General;  Laterality: N/A;   REDUCTION MAMMAPLASTY Bilateral      FAMILY HISTORY   Family History  Problem Relation Age of Onset   Dementia Mother    Diabetes Mother    Stroke Father    Pancreatic cancer Sister      SOCIAL HISTORY   Social History   Tobacco Use   Smoking status: Former    Current packs/day: 0.00    Types: Cigarettes    Quit date: 04/16/1997    Years since quitting: 26.6   Smokeless tobacco: Never  Vaping Use   Vaping status: Never Used  Substance Use Topics   Alcohol use: Yes    Alcohol/week: 2.0 standard drinks of alcohol    Types: 2 Cans of beer per week    Comment: on ocassion   Drug use: No     MEDICATIONS    Home Medication:  Current Outpatient Rx   Order #: [id number] Class: Normal   Order #:  [id number] Class: Normal   Order #: [id number] Class: Normal   Order #: [id number] Class: Normal   Order #: [id number] Class: Normal    Current Medication:  Current Outpatient Medications:    alendronate  (FOSAMAX ) 70 MG tablet, Take 1 tablet (70 mg total) by mouth once a week. Take with a full glass of water on an empty stomach., Disp: 52 tablet, Rfl: 0   atorvastatin  (LIPITOR) 10 MG tablet, TAKE 1 TABLET(10 MG) BY MOUTH DAILY, Disp: 90 tablet, Rfl: 3   escitalopram  (LEXAPRO ) 10 MG tablet, Take 1 tablet (10 mg total) by mouth daily., Disp: 90 tablet, Rfl: 3   linaclotide  (LINZESS ) 145 MCG CAPS capsule, Take 1 capsule  (145 mcg total) by mouth daily before breakfast., Disp: 90 capsule, Rfl: 3   zolpidem  (AMBIEN ) 5 MG tablet, TAKE 1 TABLET(5 MG) BY MOUTH AT BEDTIME AS NEEDED FOR SLEEP, Disp: 90 tablet, Rfl: 3    ALLERGIES   Patient has no allergy information on record.   BP 110/70 (BP Location: Right Arm, Patient Position: Sitting, Cuff Size: Normal)   Pulse 65   Temp 97.8 F (36.6 C) (Oral)   Ht 5\' 5"  (1.651 m)   Wt 163 lb 12.8 oz (74.3 kg)   SpO2 99%   BMI 27.26 kg/m    Review of Systems: Gen:  Denies  fever, sweats, chills weight loss  HEENT: Denies blurred vision, double vision, ear pain, eye pain, hearing loss, nose bleeds, sore throat Cardiac:  No dizziness, chest pain or heaviness, chest tightness,edema, No JVD Resp:   No cough, -sputum production, -shortness of breath,-wheezing, -hemoptysis,  Other:  All other systems negative   Physical Examination:   General Appearance: No distress  EYES PERRLA, EOM intact.   NECK Supple, No JVD Pulmonary: normal breath sounds, No wheezing.  CardiovascularNormal S1,S2.  No m/r/g.   Abdomen: Benign, Soft, non-tender. Neurology UE/LE 5/5 strength, no focal deficits Ext pulses intact, cap refill intact ALL OTHER ROS ARE NEGATIVE     ASSESSMENT/PLAN   63 year old pleasant white female seen today for abnormal CT chest reviewed in detail with the patient  There are 2 abnormalities in the right lung Nodular opacification in the posterior lateral right lower lobe is most concerning finding at this time Recommend PET scan for further evaluation I have explained to her that we will need this before we perform any type of diagnostic interventions This will also assess the changes from February 2025  Avoid Allergens and Irritants Avoid secondhand smoke Avoid SICK contacts Recommend  Masking  when appropriate Recommend Keep up-to-date with vaccinations No exacerbation at this time No evidence of heart failure at this time No evidence or  signs of infection at this time No respiratory distress No fevers, chills, nausea, vomiting, diarrhea No evidence of lower extremity edema No evidence hemoptysis   MEDICATION ADJUSTMENTS/LABS AND TESTS ORDERED: Obtain PET scan as soon as possible Avoid Allergens and Irritants Avoid secondhand smoke Avoid SICK contacts Recommend  Masking  when appropriate Recommend Keep up-to-date with vaccinations    CURRENT MEDICATIONS REVIEWED AT LENGTH WITH PATIENT TODAY   Patient  satisfied with Plan of action and management. All questions answered  Follow up 1 week  I spent a total of 60 minutes reviewing chart data, face-to-face evaluation with the patient, counseling and coordination of care as detailed above.     Lady Pier, M.D.  [facility]  Medical Director [facility] Medical Director [facility] Cardio-Pulmonary Department          ;

## 2023-12-12 ENCOUNTER — Telehealth: Payer: Self-pay | Admitting: *Deleted

## 2023-12-12 ENCOUNTER — Ambulatory Visit
Admission: RE | Admit: 2023-12-12 | Discharge: 2023-12-12 | Disposition: A | Discharge status: Home / Self Care | Source: Ambulatory Visit | Attending: Internal Medicine | Admitting: Internal Medicine

## 2023-12-12 DIAGNOSIS — R918 Other nonspecific abnormal finding of lung field: Secondary | ICD-10-CM | POA: Diagnosis present

## 2023-12-12 DIAGNOSIS — K5730 Diverticulosis of large intestine without perforation or abscess without bleeding: Secondary | ICD-10-CM | POA: Insufficient documentation

## 2023-12-12 DIAGNOSIS — R590 Localized enlarged lymph nodes: Secondary | ICD-10-CM | POA: Insufficient documentation

## 2023-12-12 LAB — GLUCOSE, CAPILLARY: Glucose-Capillary: 83 mg/dL (ref 70–99)

## 2023-12-12 MED ORDER — FLUDEOXYGLUCOSE F - 18 (FDG) INJECTION
9.1100 | Freq: Once | INTRAVENOUS | Status: AC | PRN
  Administered 2023-12-12: 9.11 via INTRAVENOUS

## 2023-12-12 NOTE — Telephone Encounter (Signed)
 Copied from CRM [id number]. Topic: Clinical - Lab/Test Results >> Dec 12, 2023  1:10 PM Margarette Shawl wrote: Reason for CRM:   Barbara Bradford, with [facility], is calling to report PET scan results to nurse at clinic. Contacted [facility]; however, nurse was not avail.  Requests call back to report results  CB#  [phone]  PET 12/12/23   IMPRESSION: Right lower lobe areas of nodular opacity have shown significant interval improvement with some residual. No abnormal uptake. This could be infectious or inflammatory. Recommend follow-up CT scan in 3 months.   There is a small focus of wall thickening and stranding in the distal transverse colon along an area of diverticula. Uptake in this location is noted. An area of subtle diverticulitis is possible. Please correlate with symptoms and recommend attention on follow-up CT.   Two foci of small nodular uptake along the anterior abdominal wall in the right upper quadrant. One subcutaneous fat and 1 along the musculature. These measure up to 9 mm. The subcutaneous fat nodule is larger than the prior CT scan of 10/08/2023. Please correlate for any history of trauma or intervention. Otherwise recommend either short follow-up or further workup with either ultrasound or MRI to exclude a true soft tissue lesion.   Findings will be called to the ordering service by the Radiology physician assistant team.     Electronically Signed   By: Adrianna Horde M.D.   On: 12/12/2023 12:59  Routing to Dr Auston Left as urgent  MJ at [facility] rad aware report was recieved

## 2023-12-15 ENCOUNTER — Other Ambulatory Visit: Payer: Self-pay | Admitting: Internal Medicine

## 2023-12-15 DIAGNOSIS — J181 Lobar pneumonia, unspecified organism: Secondary | ICD-10-CM

## 2023-12-15 NOTE — Progress Notes (Signed)
 PET scan results reviewed with patient Follow up CT chest in 3 months

## 2023-12-18 ENCOUNTER — Ambulatory Visit: Admitting: Internal Medicine

## 2024-02-20 ENCOUNTER — Other Ambulatory Visit: Payer: Self-pay | Admitting: Family Medicine

## 2024-02-20 DIAGNOSIS — E785 Hyperlipidemia, unspecified: Secondary | ICD-10-CM

## 2024-03-12 ENCOUNTER — Other Ambulatory Visit: Payer: Self-pay

## 2024-03-16 ENCOUNTER — Other Ambulatory Visit: Payer: Self-pay | Admitting: Family Medicine

## 2024-03-16 ENCOUNTER — Encounter: Payer: Self-pay | Admitting: Family Medicine

## 2024-03-16 DIAGNOSIS — G4700 Insomnia, unspecified: Secondary | ICD-10-CM

## 2024-03-16 MED ORDER — ZOLPIDEM TARTRATE 5 MG PO TABS: 5.0000 mg | ORAL_TABLET | Freq: Every evening | ORAL | 3 refills | Status: DC | PRN

## 2024-05-21 ENCOUNTER — Other Ambulatory Visit: Payer: Self-pay | Admitting: Family Medicine

## 2024-06-07 ENCOUNTER — Emergency Department (HOSPITAL_BASED_OUTPATIENT_CLINIC_OR_DEPARTMENT_OTHER)
Admission: EM | Admit: 2024-06-07 | Discharge: 2024-06-07 | Disposition: A | Discharge status: Home / Self Care | Attending: Emergency Medicine | Admitting: Emergency Medicine

## 2024-06-07 ENCOUNTER — Emergency Department (HOSPITAL_BASED_OUTPATIENT_CLINIC_OR_DEPARTMENT_OTHER)

## 2024-06-07 ENCOUNTER — Other Ambulatory Visit: Payer: Self-pay

## 2024-06-07 ENCOUNTER — Encounter (HOSPITAL_BASED_OUTPATIENT_CLINIC_OR_DEPARTMENT_OTHER): Payer: Self-pay | Admitting: Emergency Medicine

## 2024-06-07 DIAGNOSIS — R1032 Left lower quadrant pain: Secondary | ICD-10-CM | POA: Diagnosis present

## 2024-06-07 DIAGNOSIS — R197 Diarrhea, unspecified: Secondary | ICD-10-CM | POA: Insufficient documentation

## 2024-06-07 DIAGNOSIS — D72819 Decreased white blood cell count, unspecified: Secondary | ICD-10-CM | POA: Insufficient documentation

## 2024-06-07 DIAGNOSIS — K529 Noninfective gastroenteritis and colitis, unspecified: Secondary | ICD-10-CM

## 2024-06-07 LAB — URINALYSIS, ROUTINE W REFLEX MICROSCOPIC
Bacteria, UA: NONE SEEN
Bilirubin Urine: NEGATIVE
Glucose, UA: NEGATIVE mg/dL
Ketones, ur: 15 mg/dL — AB
Nitrite: NEGATIVE
Specific Gravity, Urine: 1.026 (ref 1.005–1.030)
pH: 6 (ref 5.0–8.0)

## 2024-06-07 LAB — COMPREHENSIVE METABOLIC PANEL WITH GFR
ALT: 25 U/L (ref 0–44)
AST: 25 U/L (ref 15–41)
Albumin: 4.4 g/dL (ref 3.5–5.0)
Alkaline Phosphatase: 48 U/L (ref 38–126)
Anion gap: 11 (ref 5–15)
BUN: 11 mg/dL (ref 8–23)
CO2: 23 mmol/L (ref 22–32)
Calcium: 9.4 mg/dL (ref 8.9–10.3)
Chloride: 103 mmol/L (ref 98–111)
Creatinine, Ser: 0.78 mg/dL (ref 0.44–1.00)
GFR, Estimated: >60 mL/min (ref >60)
Glucose, Bld: 90 mg/dL (ref 70–99)
Potassium: 4 mmol/L (ref 3.5–5.1)
Sodium: 136 mmol/L (ref 135–145)
Total Bilirubin: 0.8 mg/dL (ref 0.0–1.2)
Total Protein: 7.2 g/dL (ref 6.5–8.1)

## 2024-06-07 LAB — CBC
HCT: 41.2 % (ref 36.0–46.0)
Hemoglobin: 13.8 g/dL (ref 12.0–15.0)
MCH: 29.4 pg (ref 26.0–34.0)
MCHC: 33.5 g/dL (ref 30.0–36.0)
MCV: 87.8 fL (ref 80.0–100.0)
Platelets: 203 K/uL (ref 150–400)
RBC: 4.69 MIL/uL (ref 3.87–5.11)
RDW: 13.5 % (ref 11.5–15.5)
WBC: 3.4 K/uL — ABNORMAL LOW (ref 4.0–10.5)
nRBC: 0 % (ref 0.0–0.2)

## 2024-06-07 LAB — LIPASE, BLOOD: Lipase: 31 U/L (ref 11–51)

## 2024-06-07 MED ORDER — DICYCLOMINE HCL 20 MG PO TABS: 20.0000 mg | ORAL_TABLET | Freq: Two times a day (BID) | ORAL | 0 refills | Status: AC

## 2024-06-07 MED ORDER — AMOXICILLIN-POT CLAVULANATE 875-125 MG PO TABS: 1.0000 | ORAL_TABLET | Freq: Two times a day (BID) | ORAL | 0 refills | Status: AC

## 2024-06-07 MED ORDER — ONDANSETRON HCL 4 MG PO TABS: 4.0000 mg | ORAL_TABLET | Freq: Three times a day (TID) | ORAL | 0 refills | Status: AC | PRN

## 2024-06-07 MED ORDER — IOHEXOL 300 MG/ML  SOLN
100.0000 mL | Freq: Once | INTRAMUSCULAR | Status: AC | PRN
  Administered 2024-06-07: 100 mL via INTRAVENOUS

## 2024-06-07 NOTE — ED Notes (Signed)
 DC paperwork given and verbally understood.

## 2024-06-07 NOTE — ED Notes (Signed)
 Pt aware of the need for a stool sample... Pt currently unable to provide the sample.SABRASABRA

## 2024-06-07 NOTE — Discharge Instructions (Signed)
 You were seen in the emergency department for chief complaint of diarrhea and left lower quadrant abdominal pain.  Although there were no abnormal findings on your workup today as discussed I am going to treat you with Augmentin  given your history of recurrent diverticulitis.  I have also ordered Bentyl for treatment of colon spasm and Zofran  for nausea.  Follow closely with your primary care physician to provide stool sample for evaluation of pathogenesis.  Contact a health care provider if: You have a fever. Your diarrhea gets worse. You have new symptoms. You vomit every time you eat or drink. You feel light-headed, dizzy, or have a headache. You have muscle cramps. You have signs of dehydration, such as: Dark urine, very little urine, or no urine. Cracked lips. Dry mouth. Sunken eyes. Sleepiness. Weakness. You have bloody or black stools or stools that look like tar. You have severe pain, cramping, or bloating in your abdomen. Your skin feels cold and clammy. You feel confused. Get help right away if: You have chest pain or your heart is beating very quickly. You have trouble breathing or you are breathing very quickly. You feel extremely weak or you faint. These symptoms may be an emergency. Get help right away. Call 911. Do not wait to see if the symptoms will go away. Do not drive yourself to the hospital.

## 2024-06-07 NOTE — ED Triage Notes (Signed)
 C/o LLQ pain x 3 days. States had intermittent diarrhea x 2 weeks. Denies fevers.

## 2024-06-07 NOTE — ED Provider Notes (Signed)
 [place] EMERGENCY DEPARTMENT AT [facility] Provider Note   CSN: [id number] Arrival date & time: 06/07/24  9148     Patient presents with: Abdominal Pain   Barbara Bradford is a 63 y.o. female who presents emergency department chief complaint of left lower quadrant abdominal pain and diarrhea.  Patient reports that she normally has to take Linzess  because of constipation but has been having up to 10 diarrheas a day for the past month or more.  Patient reports that it is so significant she sometimes wakes up stooling and has to run to the bathroom.  She states that it is not water but more like silt with foul odor and mucus.  She is status post sigmoid colectomy with reanastomosis due to recurrent episodes of diverticulitis.  Patient reports that this feels similar to diverticulitis as she has had progressively worsening left lower quadrant abdominal pain achiness and pain with stooling for the past 2 days.  She denies any recent antibiotic use she denies fever chills nausea or vomiting.  She states that her pain is fairly severe but declines an offer for pain medication at this time.  Pain is not worsened by movement coughing or laughing.   The history is provided by the patient and a relative.  Abdominal Pain      Prior to Admission medications   Medication Sig Start Date End Date Taking? Authorizing Provider  alendronate  (FOSAMAX ) 70 MG tablet Take 1 tablet (70 mg total) by mouth once a week. Take with a full glass of water on an empty stomach. 10/09/23   Duanne Butler DASEN, MD  atorvastatin  (LIPITOR) 10 MG tablet TAKE 1 TABLET(10 MG) BY MOUTH DAILY 02/20/24   Duanne Butler DASEN, MD  escitalopram  (LEXAPRO ) 10 MG tablet Take 1 tablet (10 mg total) by mouth daily. 09/29/23   Duanne Butler DASEN, MD  LINZESS  145 MCG CAPS capsule TAKE 1 CAPSULE(145 MCG) BY MOUTH DAILY BEFORE BREAKFAST 05/24/24   Duanne Butler DASEN, MD  Semaglutide-Weight Management 2.4 MG/0.75ML SOAJ Inject 2.4 mg  into the skin. 09/30/23   [provider]  zolpidem  (AMBIEN ) 5 MG tablet Take 1 tablet (5 mg total) by mouth at bedtime as needed for sleep. 03/16/24   Duanne Butler DASEN, MD    Allergies: Patient has no known allergies.    Review of Systems  Gastrointestinal:  Positive for abdominal pain.    Updated Vital Signs BP 120/78 (BP Location: Right Arm)   Pulse 74   Temp 98.7 F (37.1 C) (Oral)   Resp 18   SpO2 99%   Physical Exam Vitals and nursing note reviewed.  Constitutional:      General: She is not in acute distress.    Appearance: She is well-developed. She is not diaphoretic.  HENT:     Head: Normocephalic and atraumatic.     Right Ear: External ear normal.     Left Ear: External ear normal.     Nose: Nose normal.     Mouth/Throat:     Mouth: Mucous membranes are moist.  Eyes:     General: No scleral icterus.    Conjunctiva/sclera: Conjunctivae normal.  Cardiovascular:     Rate and Rhythm: Normal rate and regular rhythm.     Heart sounds: Normal heart sounds. No murmur heard.    No friction rub. No gallop.  Pulmonary:     Effort: Pulmonary effort is normal. No respiratory distress.     Breath sounds: Normal breath sounds.  Abdominal:  General: Bowel sounds are normal. There is no distension.     Palpations: Abdomen is soft. There is no mass.     Tenderness: There is abdominal tenderness in the left lower quadrant. There is guarding. There is no rebound.  Musculoskeletal:     Cervical back: Normal range of motion.  Skin:    General: Skin is warm and dry.  Neurological:     Mental Status: She is alert and oriented to person, place, and time.  Psychiatric:        Behavior: Behavior normal.     (all labs ordered are listed, but only abnormal results are displayed) Labs Reviewed  CBC - Abnormal; Notable for the following components:      Result Value   WBC 3.4 (*)    All other components within normal limits  URINALYSIS, ROUTINE W REFLEX MICROSCOPIC -  Abnormal; Notable for the following components:   Hgb urine dipstick TRACE (*)    Ketones, ur 15 (*)    Protein, ur TRACE (*)    Leukocytes,Ua MODERATE (*)    All other components within normal limits  GASTROINTESTINAL PANEL BY PCR, STOOL (REPLACES STOOL CULTURE)  C DIFFICILE QUICK SCREEN W PCR REFLEX    LIPASE, BLOOD  COMPREHENSIVE METABOLIC PANEL WITH GFR    EKG: None  Radiology: No results found.   Procedures   Medications Ordered in the ED - No data to display  Clinical Course as of 06/07/24 1313  Mon Jun 07, 2024  1008 WBC(!): 3.4 [AH]  1008 Comprehensive metabolic panel [AH]  1008 Lipase, blood [AH]  1008 Bacteria, UA: NONE SEEN [AH]  1009 Labs reviewed.  Mild leukopenia at 3.4 thousand. 63 year old female presents to the emergency department with chief complaint of left lower quadrant abdominal pain and 1 month of diarrhea.  Patient has not been taking her Linzess  for that period of time.  Patient is now reporting left lower quadrant abdominal pain and is status post sigmoid colectomy with reanastomosis.  Differential diagnosis includes recurrent diverticulitis, colitis, proctitis, less likely bowel obstruction as she is having frequent stooling events.  Patient is not having any urinary symptoms however urine appears contaminated.  I have low suspicion for UTI based on lack of Dems.  Patient is also less likely to have ureteral colic, ovarian torsion, tubo-ovarian abscess or other female organ pathology.  I have ordered CT abdomen pelvis.  Patient declined pain medication that was offered.  She is not nauseous or having vomiting at this time. [AH]  1308 Patient unable to provide a stool sample at this time. She is feeling improved and would like to be discharged  [AH]  1312 Patient will be discharged at this time with treatment for presumed development of diverticulitis.  I reviewed risks and benefits and patient is agreement with current plan.  Will discharge with Augmentin ,  Zofran  and Bentyl.  She may provide stool sample to her PCP if she is unable to provide when here.  Given her lack of frequency and severe pain no evidence of colitis I have very low suspicion for C. difficile colitis.  She is stable and appears appropriate for discharge at this time. [AH]    Clinical Course User Index [AH] Arloa Chroman, PA-C                                 Medical Decision Making Amount and/or Complexity of Data Reviewed Labs: ordered. Decision-making  details documented in ED Course. Radiology: ordered and independent interpretation performed.  Risk Prescription drug management.        Final diagnoses:  None    ED Discharge Orders     None          Arloa Chroman, PA-C 06/07/24 1313    Bari Roxie HERO, DO 06/07/24 1344

## 2024-06-15 ENCOUNTER — Encounter: Payer: Self-pay | Admitting: Family Medicine

## 2024-06-15 ENCOUNTER — Ambulatory Visit: Payer: Self-pay

## 2024-06-15 ENCOUNTER — Ambulatory Visit: Admitting: Family Medicine

## 2024-06-15 VITALS — BP 126/76 | HR 69 | Temp 97.9°F | Ht 65.0 in | Wt 146.5 lb

## 2024-06-15 DIAGNOSIS — R5383 Other fatigue: Secondary | ICD-10-CM

## 2024-06-15 DIAGNOSIS — G4700 Insomnia, unspecified: Secondary | ICD-10-CM

## 2024-06-15 DIAGNOSIS — R208 Other disturbances of skin sensation: Secondary | ICD-10-CM

## 2024-06-15 MED ORDER — TRAZODONE HCL 50 MG PO TABS: 50.0000 mg | ORAL_TABLET | Freq: Every day | ORAL | 0 refills | Status: AC

## 2024-06-15 NOTE — Telephone Encounter (Signed)
 FYI Only or Action Required?: FYI only for provider: appointment scheduled on 11/4.  Patient was last seen in primary care on 09/29/2023 by Barbara Butler DASEN, MD.  Called Nurse Triage reporting Neck Pain.  Symptoms began several days ago.  Interventions attempted: OTC medications: Tylenol  and Ibuprofen .  Symptoms are: gradually worsening.  Triage Disposition: See HCP Within 4 Hours (Or PCP Triage)  Patient/caregiver understands and will follow disposition?: Yes  Copied from CRM [id number]. Topic: Clinical - Red Word Triage >> Jun 15, 2024 10:29 AM Selinda RAMAN wrote: Red Word that prompted transfer to Nurse Triage: The patient called in stating she has been in extreme pain since last week. She complains of nerve like pain in the back of her head, neck, arms, back and legs. She has tried tylenol  and ibuprofen  for relief but they do not help. I will transfer her to E2C2 NT. Reason for Disposition  [1] SEVERE pain (e.g., excruciating, unable to do any normal activities) AND [2] not improved 2 hours after pain medicine  Answer Assessment - Initial Assessment Questions Patient with nerve like pain in her back, arms, neck, and legs. Started over the weekend. Was seen in the ED last week for diverticulitis flare. Was prescribed Augmentin  (completed Monday), Bentyl, and Zofran . Those symptoms have vastly improved. Everything is sensitive. Cannot put her hair up because it feels like its pulling out her hair. Skin sensitive to touch. Tolerable if nothing is touching it. Tylenol  and ibuprofen  with no benefit Denies rash, tick bite, autoimmune or genetic disorder. Does not supplement with B12. Denies CP, SOB, Weakness. Has situational dizziness when standing up from being bent over tending to her garden.   Endorses strong fatigue- states she is ready to fall asleep by 8pm and usually is ok to be out to dinner until 10pm.  [facility] contacted for OV. Appt found for this afternoon. ED/UC precautions advised.  1.  ONSET: When did the muscle aches or body pains start?      Last week  2. LOCATION: What part of your body is hurting? (e.g., entire body, arms, legs)      Generalized nerve pain all over 3. SEVERITY: How bad is the pain? (Scale 1-10; or mild, moderate, severe)     3-4/10 if not touching anything, 6-7/10 when touching 4. CAUSE: What do you think is causing the pains?     unsure 5. FEVER: Do you have a fever? If Yes, ask: What is your temperature, how was it measured, and  when did it start?      denies 6. OTHER SYMPTOMS: Do you have any other symptoms? (e.g., chest pain, cold or flu symptoms, rash, weakness, weight loss)     Fatigue- Denies CP, SOB, rash, cold or flu symptoms.  8. TRAVEL: Have you traveled out of the country in the last month? (e.g., exposures, travel history)     denies  Protocols used: Muscle Aches and Body Pain-A-AH

## 2024-06-15 NOTE — Progress Notes (Unsigned)
 Patient Office Visit  Assessment & Plan:  Burning sensation  Other fatigue -     TSH -     VITAMIN D 25 Hydroxy (Vit-D Deficiency, Fractures) -     Vitamin B12 -     Iron, TIBC and Ferritin Panel  Insomnia, unspecified type  Other orders -     traZODone  HCl; Take 1 tablet (50 mg total) by mouth at bedtime. Take one hour prior to bedtime  Dispense: 90 tablet; Refill: 0   Assessment and Plan    Disturbance of skin sensation Burning sensation and hypersensitivity on arms, neck, and head. No clear etiology. Differential includes stress, TMJ-related pain, or medication withdrawal. - Ordered blood tests: thyroid function, vitamin D, B12, iron panel. - Consider stress management and lifestyle modifications.  Fatigue Persistent fatigue with poor sleep. Ambien  provides limited relief. Recent Lexapro  cessation may contribute. No anemia or electrolyte imbalance. Possible stress-related. - Ordered blood tests: thyroid function, vitamin D, B12, iron panel. - Consider stress management and lifestyle modifications.  Insomnia Chronic insomnia with limited Ambien  relief. Trazodone  initially effective but later ineffective. Concerns about long-term Ambien  use. Discussed alternative sleep aids. - Prescribed trazodone  50 mg, one hour before bedtime. - Discussed potential side effects and gradual onset of trazodone . - Encouraged lifestyle modifications: regular exercise, cognitive activities.     Return if symptoms worsen or fail to improve.   Subjective:    Patient ID: Almarie GORMAN Chancy, female    DOB: Dec 13, 1960  Age: 63 y.o. MRN: [id number]  Chief Complaint  Patient presents with   Fatigue    Pt c/o of fatigue and feeling sensitive to the touch.     HPI Discussed the use of AI scribe software for clinical note transcription with the patient, who gave verbal consent to proceed.  History of Present Illness        History of Present Illness ILEIGH METTLER is a 64 year old  female who presents with widespread nerve pain/burning sensation and fatigue.  She experiences nerve pain primarily on the backside of both arms, her back, neck, and extending to the back of her head. The sensation is described as a burning feeling, similar to sunburn, with skin sensitivity to touch. This pain has been present for about a week and varies in intensity, sometimes worsening in the morning or evening. Patient is also having discomfort over TMJ. patient not sure if she grinds or clenches  She reports significant fatigue, going to bed at 8 PM, which is earlier than usual. Despite taking Ambien , she only manages to sleep for about five to six hours and does not feel rested upon waking. She has tried other sleep aids in the past, including trazodone  but not sure about Remeron/doxepin/pamelor etc, but has not found them effective long-term. No snoring or sleep apnea is reported.  She recently completed a course of amoxicillin  for a sore throat and ear discomfort, which she describes as persistent despite the antibiotic treatment. No fever or chills are present.  She has a history of diverticulitis, for which she had surgery last year. She went to [facility] end of October and had CT scan- She was treated with Bentyl for cramping and underwent a CT scan that showed no acute abnormalities. Recent blood work indicated a slightly low white cell count and high red cell count, normal CMP  She recently stopped taking Lexapro , which she had been using to manage stress related to her mother's passing and her father's current  state of mind. She feels cold more often than usual and has a decreased appetite, avoiding bread and sugar in an effort to lose weight.  In her family history, her mother had dementia and her grandmother had Alzheimer's disease. She expresses concern about her own risk for these conditions.  Physical Exam NEUROLOGICAL: Increased sensitivity to touch.  Results LABS WBC: mild  leukopenia RBC: erythrocytosis Lipase: within normal limits done at [facility] last week  RADIOLOGY CT abdomen: no acute abnormality; post-surgical change in sigmoid colon; diverticulosis without diverticulitis  Assessment and Plan Disturbance of skin sensation Burning sensation and hypersensitivity on arms, neck, and head. No clear etiology. Differential includes stress, TMJ-related pain, or medication withdrawal. - Ordered blood tests: thyroid function, vitamin D, B12, iron panel. - Consider stress management and lifestyle modifications.  Fatigue Persistent fatigue with poor sleep. Ambien  provides limited relief. Recent Lexapro  cessation may contribute. No anemia or electrolyte imbalance. Possible stress-related. - Ordered blood tests: thyroid function, vitamin D, B12, iron panel. - Consider stress management and lifestyle modifications.  Insomnia Chronic insomnia with limited Ambien  relief. Trazodone  initially effective but later ineffective. Concerns about long-term Ambien  use. Discussed alternative sleep aids. - Prescribed trazodone  50 mg, one hour before bedtime. - Discussed potential side effects and gradual onset of trazodone . - Encouraged lifestyle modifications: regular exercise, cognitive activities.    The 10-year ASCVD risk score (Arnett DK, et al., 2019) is: 2.7%  Past Medical History:  Diagnosis Date   Abdominal bloating    Anxiety    Arthritis    Diverticulosis    history of   Gas    at times   Hx of abnormal cervical Pap smear    Osteopenia    Past Surgical History:  Procedure Laterality Date   ABDOMINAL HYSTERECTOMY  1993   has part of one ovary   abdominaplasty  2000   AUGMENTATION MAMMAPLASTY Bilateral    implants removed a year ago   BREAST ENHANCEMENT SURGERY     CLAVICLE SURGERY     left side/had pins removed after a fracture   COLONOSCOPY     COSMETIC SURGERY  2008   Tummy tuck   CYSTOSCOPY WITH STENT PLACEMENT N/A 10/25/2022   Procedure:  CYSTOSCOPY WITH STENT PLACEMENT;  Surgeon: Nieves Cough, MD;  Location: [facility];  Service: Urology;  Laterality: N/A;   hx coloposcopy     LAPAROSCOPIC SIGMOID COLECTOMY N/A 10/25/2022   Procedure: LAPAROSCOPIC ASSISTED SIGMOID COLECTOMY;  Surgeon: Gladis Cough, MD;  Location: [facility];  Service: General;  Laterality: N/A;   REDUCTION MAMMAPLASTY Bilateral    Social History   Tobacco Use   Smoking status: Former    Current packs/day: 0.00    Types: Cigarettes    Quit date: 04/16/1997    Years since quitting: 27.1   Smokeless tobacco: Never  Vaping Use   Vaping status: Never Used  Substance Use Topics   Alcohol use: Yes    Alcohol/week: 2.0 standard drinks of alcohol    Types: 2 Cans of beer per week    Comment: on ocassion   Drug use: No   Family History  Problem Relation Age of Onset   Dementia Mother    Diabetes Mother    COPD Mother    Hypertension Mother    Obesity Mother    Stroke Father    COPD Father    Heart disease Father    Hypertension Father    Pancreatic cancer Sister    Cancer Sister  ADD / ADHD Brother    Heart disease Brother    No Known Allergies  ROS    Objective:    BP 126/76   Pulse 69   Temp 97.9 F (36.6 C)   Ht 5' 5 (1.651 m)   Wt 146 lb 8 oz (66.5 kg)   SpO2 99%   BMI 24.38 kg/m  BP Readings from Last 3 Encounters:  06/15/24 126/76  06/07/24 (!) 100/57  12/10/23 110/70   Wt Readings from Last 3 Encounters:  06/15/24 146 lb 8 oz (66.5 kg)  12/10/23 163 lb 12.8 oz (74.3 kg)  09/29/23 170 lb (77.1 kg)    Physical Exam Vitals and nursing note reviewed.  Constitutional:      General: She is not in acute distress.    Appearance: Normal appearance.  HENT:     Head: Normocephalic.     Jaw: Pain on movement present. No swelling.     Comments: TMJ- no audible clicks but has tenderness to palpation over TMJ bilaterally    Right Ear: Tympanic membrane, ear canal and external ear normal.     Left Ear: Tympanic membrane, ear  canal and external ear normal.  Eyes:     Extraocular Movements: Extraocular movements intact.     Pupils: Pupils are equal, round, and reactive to light.  Cardiovascular:     Rate and Rhythm: Normal rate and regular rhythm.     Heart sounds: Normal heart sounds.  Pulmonary:     Effort: Pulmonary effort is normal.     Breath sounds: Normal breath sounds.  Musculoskeletal:     Right lower leg: No edema.     Left lower leg: No edema.  Neurological:     General: No focal deficit present.     Mental Status: She is alert and oriented to person, place, and time.  Psychiatric:        Mood and Affect: Mood normal.        Behavior: Behavior normal.        Thought Content: Thought content normal.        Judgment: Judgment normal.      Results for orders placed or performed in visit on 06/15/24  TSH  Result Value Ref Range   TSH 9.21 (H) 0.40 - 4.50 mIU/L  VITAMIN D 25 Hydroxy (Vit-D Deficiency, Fractures)  Result Value Ref Range   Vit D, 25-Hydroxy 78 30 - 100 ng/mL  Vitamin B12  Result Value Ref Range   Vitamin B-12 1,291 (H) 200 - 1,100 pg/mL  Iron, TIBC and Ferritin Panel  Result Value Ref Range   Iron 78 45 - 160 mcg/dL   TIBC 698 749 - 549 mcg/dL (calc)   %SAT 26 16 - 45 % (calc)   Ferritin 53 16 - 288 ng/mL

## 2024-06-16 ENCOUNTER — Ambulatory Visit: Payer: Self-pay | Admitting: Family Medicine

## 2024-06-16 ENCOUNTER — Other Ambulatory Visit: Payer: Self-pay

## 2024-06-16 ENCOUNTER — Encounter: Payer: Self-pay | Admitting: Family Medicine

## 2024-06-16 LAB — IRON,TIBC AND FERRITIN PANEL
%SAT: 26 % (ref 16–45)
Ferritin: 53 ng/mL (ref 16–288)
Iron: 78 ug/dL (ref 45–160)
TIBC: 301 ug/dL (ref 250–450)

## 2024-06-16 LAB — VITAMIN D 25 HYDROXY (VIT D DEFICIENCY, FRACTURES): Vit D, 25-Hydroxy: 78 ng/mL (ref 30–100)

## 2024-06-16 LAB — VITAMIN B12: Vitamin B-12: 1291 pg/mL — ABNORMAL HIGH (ref 200–1100)

## 2024-06-16 LAB — TSH: TSH: 9.21 m[IU]/L — ABNORMAL HIGH (ref 0.40–4.50)

## 2024-06-16 MED ORDER — LEVOTHYROXINE SODIUM 25 MCG PO TABS: 25.0000 ug | ORAL_TABLET | Freq: Every day | ORAL | 3 refills | Status: AC

## 2024-08-24 ENCOUNTER — Other Ambulatory Visit (HOSPITAL_COMMUNITY): Payer: Self-pay | Admitting: Family Medicine

## 2024-08-24 ENCOUNTER — Ambulatory Visit: Admitting: Family Medicine

## 2024-08-24 VITALS — BP 118/78 | HR 63 | Temp 98.2°F | Ht 65.0 in | Wt 130.4 lb

## 2024-08-24 DIAGNOSIS — R5383 Other fatigue: Secondary | ICD-10-CM | POA: Diagnosis not present

## 2024-08-24 DIAGNOSIS — E039 Hypothyroidism, unspecified: Secondary | ICD-10-CM | POA: Diagnosis not present

## 2024-08-24 DIAGNOSIS — Z23 Encounter for immunization: Secondary | ICD-10-CM | POA: Diagnosis not present

## 2024-08-24 DIAGNOSIS — Z1231 Encounter for screening mammogram for malignant neoplasm of breast: Secondary | ICD-10-CM

## 2024-08-24 LAB — T4, FREE: Free T4: 1.4 ng/dL (ref 0.8–1.8)

## 2024-08-24 LAB — TSH: TSH: 2.24 m[IU]/L (ref 0.40–4.50)

## 2024-08-24 NOTE — Progress Notes (Signed)
 "  Patient Office Visit  Assessment & Plan:  Hypothyroidism, unspecified type -     TSH -     T4, free  Other fatigue -     TSH -     T4, free  Needs flu shot -     Flu vaccine trivalent PF, 6mos and older(Flulaval,Afluria,Fluarix,Fluzone)  Need for shingles vaccine -     Varicella-zoster vaccine IM   Assessment and Plan    Hypothyroidism Persistent fatigue despite recent weight loss. Current low-dose thyroid medication. Discussed overtreatment risks and emphasized TSH monitoring. - Rechecked thyroid function tests to assess current TSH levels. - Continue current thyroid medication dosage and adjust based on test results.  General Health Maintenance Discussed importance of flu vaccination and need for two doses of Shingrix  for shingles protection.     Patient has CPE next month Will adjust medication if necessary. Continue healthy diet and consistent exercise     No follow-ups on file.   Subjective:    Patient ID: Barbara Bradford, female    DOB: [birth year]  Age: 64 y.o. MRN: [id number]  Chief Complaint  Patient presents with   Cold Extremity   Fatigue    Pt would like her thyroid rechecked due to feeling cold and fatigued.     HPI Discussed the use of AI scribe software for clinical note transcription with the patient, who gave verbal consent to proceed.  History of Present Illness        Barbara Bradford is a 64 year old female who presents with persistent tiredness/weight loss and follow up hypothyroidism. patient is feeling better but not 100%  She has been experiencing persistent tiredness and cold intolerance for the past seven weeks. Despite sleeping from 7:30 PM to 5:30 AM, she continues to feel tired. She uses Ambien  for sleep, which helps with sleep onset but not maintenance, resulting in only four to five hours of rest. She previously tried trazodone  but discontinued it due to dry mouth and delayed onset of action.  She has lost approximately 16  pounds, dropping from 146 to 130 pounds, which she attributes to a no-carb diet, specifically avoiding sugar and bread. She notes that her previous weight measurement might have been affected by wearing shoes and a coat.  She is on a low dose of thyroid medication. She is feeling better overall. Friends wonder why she is taking such a low dose of thyroid medication. No family history of thyroid disease, although her brother had a thyroid nodule.  She has not yet received her flu shot for the season and has not received the second dose of the Shingrix  vaccine. She recalls that the first dose caused significant arm soreness.  Physical Exam MEASUREMENTS: Weight- 130.  Results Labs TSH: Elevated  Assessment and Plan Hypothyroidism Persistent fatigue despite recent weight loss. Current low-dose thyroid medication. Discussed overtreatment risks and emphasized TSH monitoring. - Rechecked thyroid function tests to assess current TSH levels. - Continue current thyroid medication dosage and adjust based on test results.  General Health Maintenance Discussed importance of flu vaccination and need for two doses of Shingrix  for shingles protection.    The 10-year ASCVD risk score (Arnett DK, et al., 2019) is: 2.4%  Past Medical History:  Diagnosis Date   Abdominal bloating    Anxiety    Arthritis    Diverticulosis    history of   Gas    at times   Hx of abnormal cervical Pap smear  Osteopenia    Past Surgical History:  Procedure Laterality Date   ABDOMINAL HYSTERECTOMY  1993   has part of one ovary   abdominaplasty  2000   AUGMENTATION MAMMAPLASTY Bilateral    implants removed a year ago   BREAST ENHANCEMENT SURGERY     CLAVICLE SURGERY     left side/had pins removed after a fracture   COLONOSCOPY     COSMETIC SURGERY  2008   Tummy tuck   CYSTOSCOPY WITH STENT PLACEMENT N/A 10/25/2022   Procedure: CYSTOSCOPY WITH STENT PLACEMENT;  Surgeon: Nieves Cough, MD;  Location: [facility];  Service: Urology;  Laterality: N/A;   hx coloposcopy     LAPAROSCOPIC SIGMOID COLECTOMY N/A 10/25/2022   Procedure: LAPAROSCOPIC ASSISTED SIGMOID COLECTOMY;  Surgeon: Gladis Cough, MD;  Location: [facility];  Service: General;  Laterality: N/A;   REDUCTION MAMMAPLASTY Bilateral    Social History[1] Family History  Problem Relation Age of Onset   Dementia Mother    Diabetes Mother    COPD Mother    Hypertension Mother    Obesity Mother    Stroke Father    COPD Father    Heart disease Father    Hypertension Father    Pancreatic cancer Sister    Cancer Sister    ADD / ADHD Brother    Heart disease Brother    Thyroid nodules Brother    Allergies[2]  ROS    Objective:    BP 118/78   Pulse 63   Temp 98.2 F (36.8 C)   Ht 5' 5 (1.651 m)   Wt 130 lb 6 oz (59.1 kg)   SpO2 99%   BMI 21.70 kg/m  BP Readings from Last 3 Encounters:  08/24/24 118/78  06/15/24 126/76  06/07/24 (!) 100/57   Wt Readings from Last 3 Encounters:  08/24/24 130 lb 6 oz (59.1 kg)  06/15/24 146 lb 8 oz (66.5 kg)  12/10/23 163 lb 12.8 oz (74.3 kg)    Physical Exam Vitals and nursing note reviewed.  Constitutional:      General: She is not in acute distress.    Appearance: Normal appearance.  HENT:     Head: Normocephalic.     Right Ear: Tympanic membrane, ear canal and external ear normal.     Left Ear: Tympanic membrane, ear canal and external ear normal.  Eyes:     Extraocular Movements: Extraocular movements intact.     Conjunctiva/sclera: Conjunctivae normal.     Pupils: Pupils are equal, round, and reactive to light.  Cardiovascular:     Rate and Rhythm: Normal rate and regular rhythm.     Heart sounds: Normal heart sounds.  Pulmonary:     Effort: Pulmonary effort is normal.     Breath sounds: Normal breath sounds.  Musculoskeletal:     Right lower leg: No edema.     Left lower leg: No edema.  Neurological:     General: No focal deficit present.     Mental Status: She is  alert and oriented to person, place, and time.  Psychiatric:        Mood and Affect: Mood normal.        Behavior: Behavior normal.        Thought Content: Thought content normal.        Judgment: Judgment normal.      No results found for any visits on 08/24/24.          [1]  Social History Tobacco Use  Smoking status: Former    Current packs/day: 0.00    Types: Cigarettes    Quit date: 04/16/1997    Years since quitting: 27.3   Smokeless tobacco: Never  Vaping Use   Vaping status: Never Used  Substance Use Topics   Alcohol use: Yes    Alcohol/week: 2.0 standard drinks of alcohol    Types: 2 Cans of beer per week    Comment: on ocassion   Drug use: No  [2] No Known Allergies  "

## 2024-08-25 ENCOUNTER — Ambulatory Visit: Payer: Self-pay | Admitting: Family Medicine

## 2024-09-16 ENCOUNTER — Other Ambulatory Visit: Payer: Self-pay | Admitting: Family Medicine

## 2024-09-16 DIAGNOSIS — G4700 Insomnia, unspecified: Secondary | ICD-10-CM

## 2024-09-30 ENCOUNTER — Encounter: Payer: No Typology Code available for payment source | Admitting: Family Medicine

## 2024-10-08 ENCOUNTER — Ambulatory Visit (HOSPITAL_COMMUNITY)
